# Patient Record
Sex: Male | Born: 1965 | Race: White | Hispanic: No | Marital: Married
Health system: Southern US, Community
[De-identification: ages and names within clinical notes are randomized; demographics above are authoritative.]

---

## 2015-04-22 ENCOUNTER — Other Ambulatory Visit (HOSPITAL_COMMUNITY): Payer: Self-pay | Admitting: Family Medicine

## 2015-04-22 DIAGNOSIS — N183 Hypertensive chronic kidney disease with stage 1 through stage 4 chronic kidney disease, or unspecified chronic kidney disease: Secondary | ICD-10-CM

## 2015-04-22 DIAGNOSIS — N19 Unspecified kidney failure: Secondary | ICD-10-CM

## 2015-04-22 DIAGNOSIS — I129 Hypertensive chronic kidney disease with stage 1 through stage 4 chronic kidney disease, or unspecified chronic kidney disease: Secondary | ICD-10-CM

## 2015-04-27 ENCOUNTER — Ambulatory Visit (HOSPITAL_COMMUNITY)
Admission: RE | Admit: 2015-04-27 | Discharge: 2015-04-27 | Disposition: A | Discharge status: Home / Self Care | Payer: Commercial Managed Care - PPO | Source: Ambulatory Visit | Attending: Family Medicine | Admitting: Family Medicine

## 2015-04-27 DIAGNOSIS — N183 Chronic kidney disease, stage 3 unspecified: Secondary | ICD-10-CM

## 2015-04-27 DIAGNOSIS — N19 Unspecified kidney failure: Secondary | ICD-10-CM

## 2015-04-27 DIAGNOSIS — I129 Hypertensive chronic kidney disease with stage 1 through stage 4 chronic kidney disease, or unspecified chronic kidney disease: Secondary | ICD-10-CM

## 2015-08-06 IMAGING — US US RENAL
1 series · 14 of 25 positions shown · non-contrast
Comparison: None.

CLINICAL DATA: Stage 3 chronic kidney disease

EXAM:
RENAL / URINARY TRACT ULTRASOUND COMPLETE

[Series 1: us renal · 0.25mm/px · 14 of 30 slices shown]
[im 1/30]
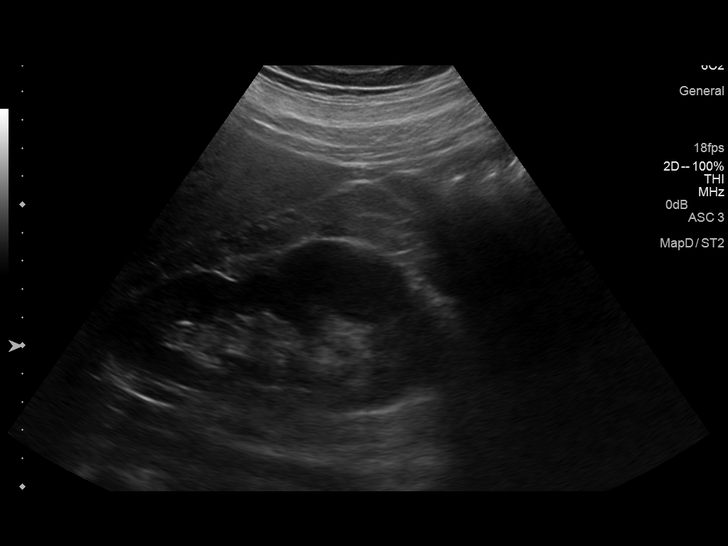
[im 3/30]
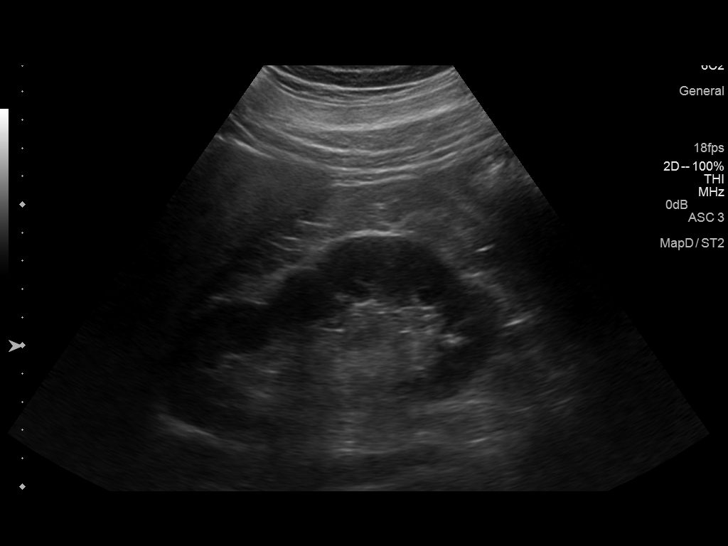
[im 5/30]
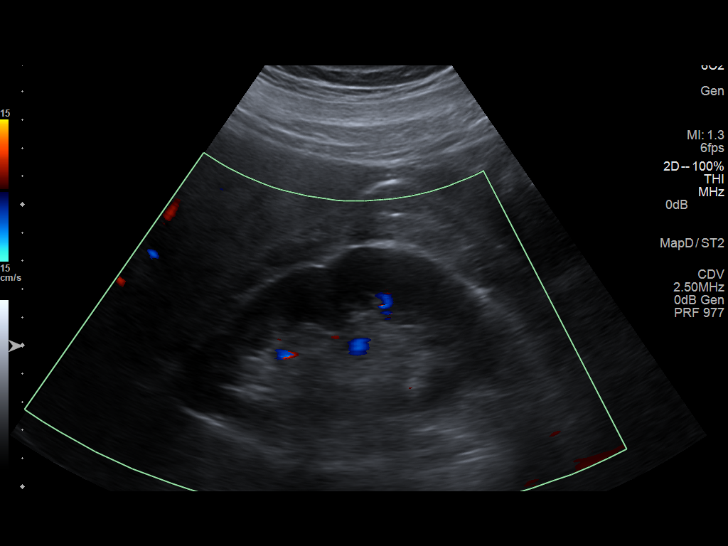
[im 8/30]
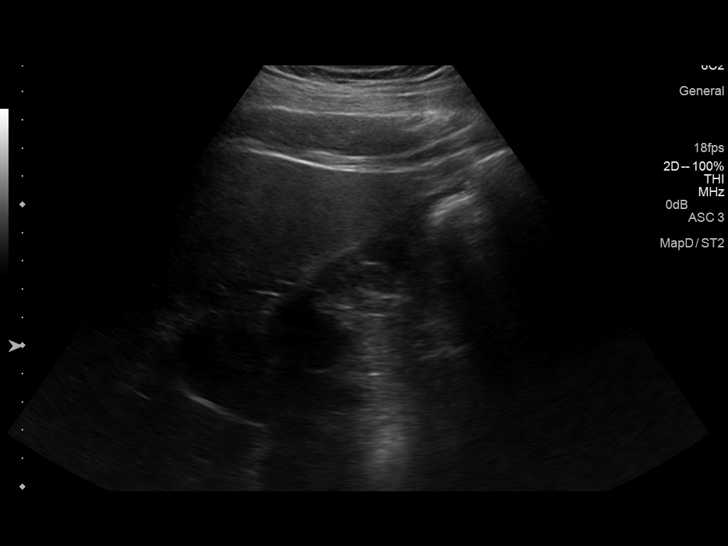
[im 10/30]
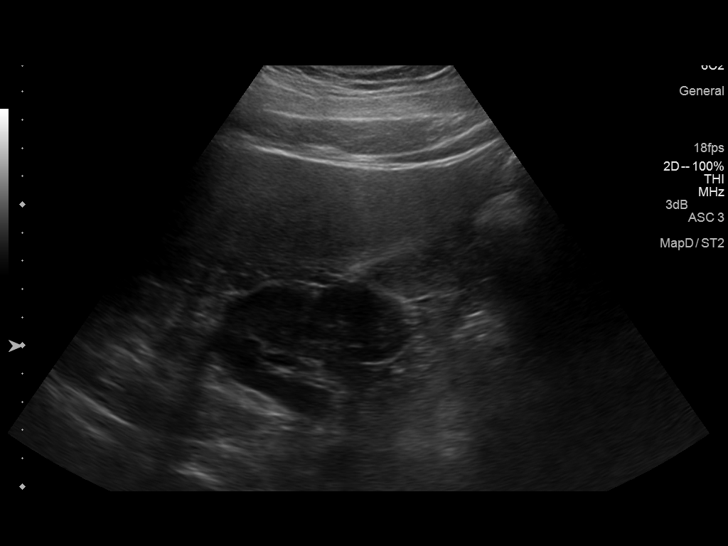
[im 11/30]
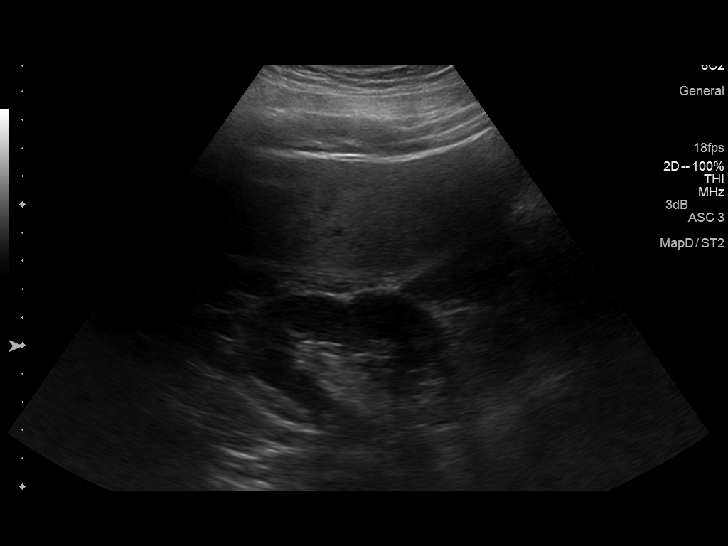
[im 14/30]
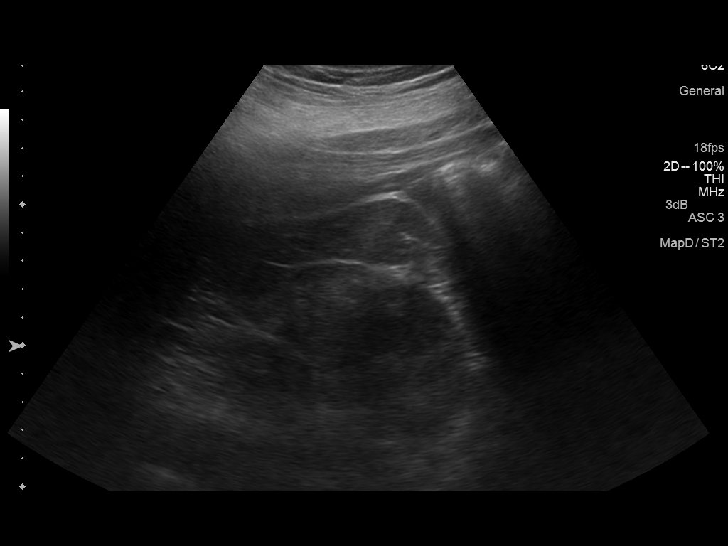
[im 16/30]
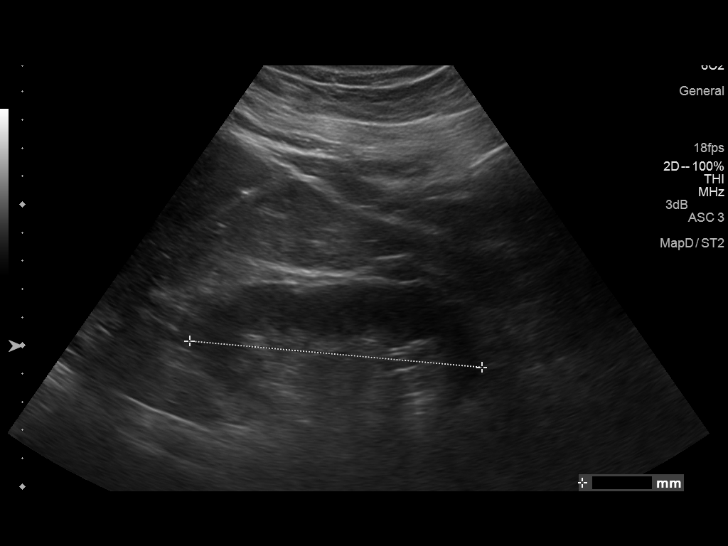
[im 19/30]
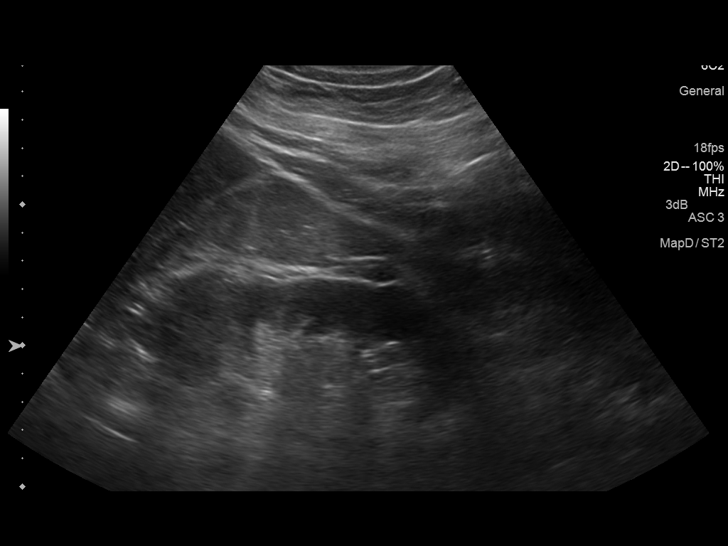
[im 20/30]
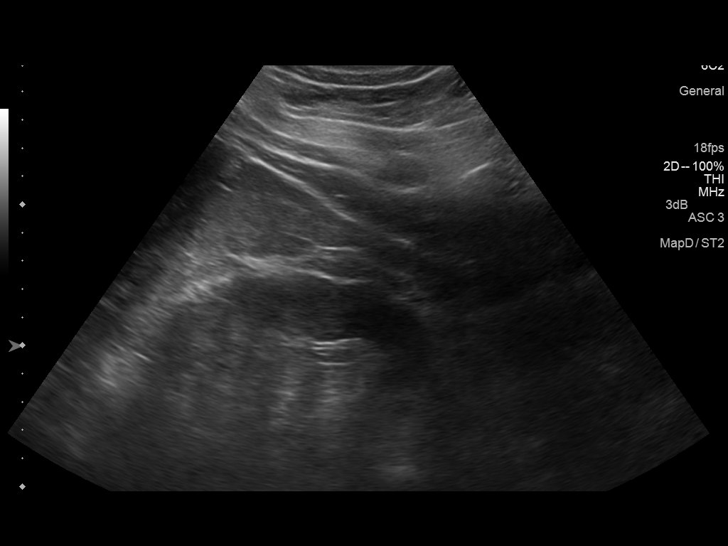
[im 22/30]
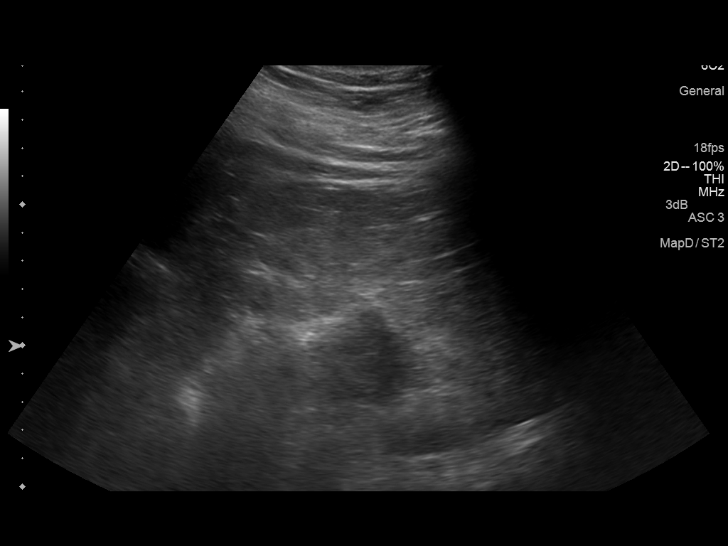
[im 25/30]
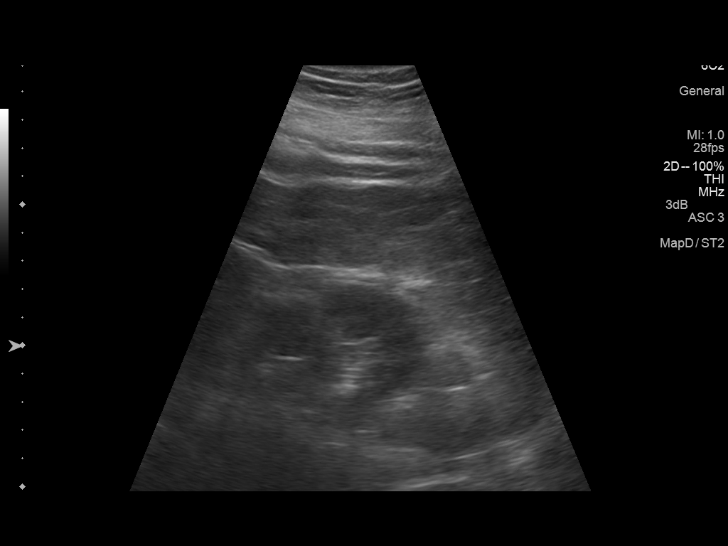
[im 27/30]
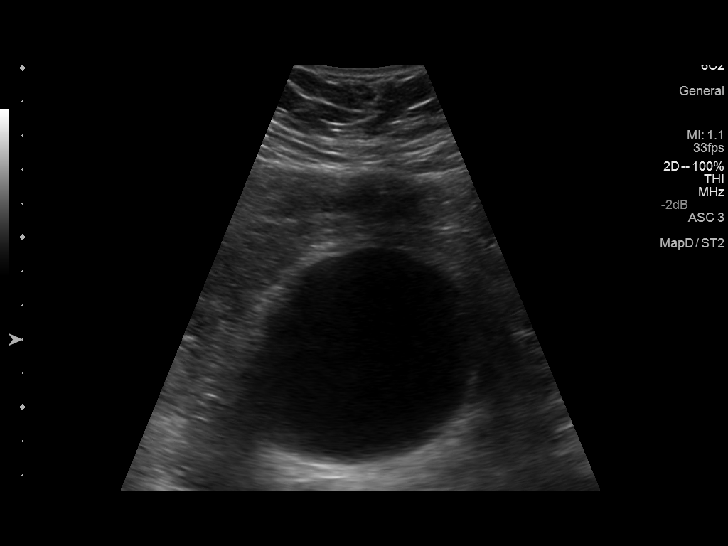
[im 30/30]
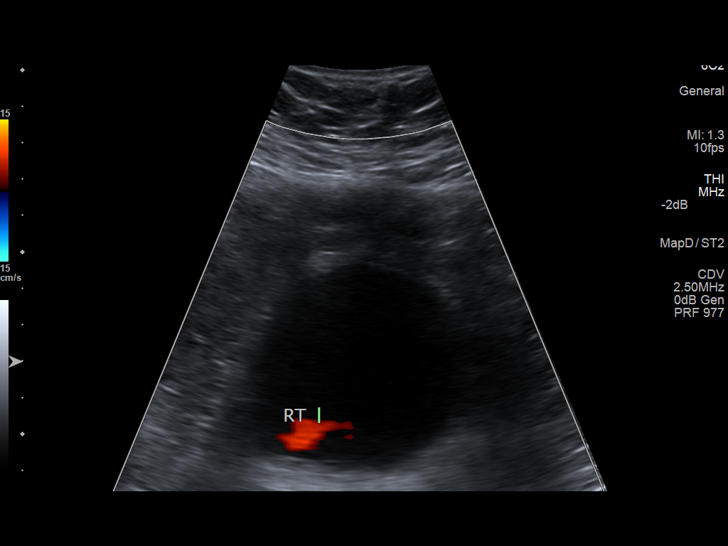

[14 of 25 positions shown; findings below may reference images not displayed]

FINDINGS: Right Kidney:

Length: 12.6 cm. Echogenicity within normal limits. No mass or
hydronephrosis visualized.

Left Kidney:

Length: 10.4 cm. Echogenicity within normal limits. No mass or
hydronephrosis visualized.

Bladder:

Appears normal for degree of bladder distention.
IMPRESSION: Normal renal ultrasound

## 2016-01-13 DIAGNOSIS — R7301 Impaired fasting glucose: Secondary | ICD-10-CM | POA: Diagnosis not present

## 2016-01-13 DIAGNOSIS — E782 Mixed hyperlipidemia: Secondary | ICD-10-CM | POA: Diagnosis not present

## 2016-01-13 DIAGNOSIS — N183 Chronic kidney disease, stage 3 (moderate): Secondary | ICD-10-CM | POA: Diagnosis not present

## 2016-01-13 DIAGNOSIS — I10 Essential (primary) hypertension: Secondary | ICD-10-CM | POA: Diagnosis not present

## 2016-01-20 DIAGNOSIS — G4733 Obstructive sleep apnea (adult) (pediatric): Secondary | ICD-10-CM | POA: Diagnosis not present

## 2016-01-20 DIAGNOSIS — F410 Panic disorder [episodic paroxysmal anxiety] without agoraphobia: Secondary | ICD-10-CM | POA: Diagnosis not present

## 2016-01-20 DIAGNOSIS — E8881 Metabolic syndrome: Secondary | ICD-10-CM | POA: Diagnosis not present

## 2016-01-20 DIAGNOSIS — E782 Mixed hyperlipidemia: Secondary | ICD-10-CM | POA: Diagnosis not present

## 2016-01-20 DIAGNOSIS — Z1389 Encounter for screening for other disorder: Secondary | ICD-10-CM | POA: Diagnosis not present

## 2016-01-24 DIAGNOSIS — G4733 Obstructive sleep apnea (adult) (pediatric): Secondary | ICD-10-CM | POA: Diagnosis not present

## 2016-03-06 DIAGNOSIS — S91312A Laceration without foreign body, left foot, initial encounter: Secondary | ICD-10-CM | POA: Diagnosis not present

## 2016-03-06 DIAGNOSIS — Z23 Encounter for immunization: Secondary | ICD-10-CM | POA: Diagnosis not present

## 2016-03-10 DIAGNOSIS — F410 Panic disorder [episodic paroxysmal anxiety] without agoraphobia: Secondary | ICD-10-CM | POA: Diagnosis not present

## 2016-03-10 DIAGNOSIS — E8881 Metabolic syndrome: Secondary | ICD-10-CM | POA: Diagnosis not present

## 2016-03-10 DIAGNOSIS — G4733 Obstructive sleep apnea (adult) (pediatric): Secondary | ICD-10-CM | POA: Diagnosis not present

## 2016-03-10 DIAGNOSIS — E782 Mixed hyperlipidemia: Secondary | ICD-10-CM | POA: Diagnosis not present

## 2016-03-10 DIAGNOSIS — I10 Essential (primary) hypertension: Secondary | ICD-10-CM | POA: Diagnosis not present

## 2016-03-16 DIAGNOSIS — E782 Mixed hyperlipidemia: Secondary | ICD-10-CM | POA: Diagnosis not present

## 2016-03-16 DIAGNOSIS — E8881 Metabolic syndrome: Secondary | ICD-10-CM | POA: Diagnosis not present

## 2016-03-16 DIAGNOSIS — F410 Panic disorder [episodic paroxysmal anxiety] without agoraphobia: Secondary | ICD-10-CM | POA: Diagnosis not present

## 2016-03-16 DIAGNOSIS — G4733 Obstructive sleep apnea (adult) (pediatric): Secondary | ICD-10-CM | POA: Diagnosis not present

## 2016-04-26 DIAGNOSIS — G4733 Obstructive sleep apnea (adult) (pediatric): Secondary | ICD-10-CM | POA: Diagnosis not present

## 2016-07-19 DIAGNOSIS — R7301 Impaired fasting glucose: Secondary | ICD-10-CM | POA: Diagnosis not present

## 2016-07-19 DIAGNOSIS — F410 Panic disorder [episodic paroxysmal anxiety] without agoraphobia: Secondary | ICD-10-CM | POA: Diagnosis not present

## 2016-07-19 DIAGNOSIS — I10 Essential (primary) hypertension: Secondary | ICD-10-CM | POA: Diagnosis not present

## 2016-07-19 DIAGNOSIS — E782 Mixed hyperlipidemia: Secondary | ICD-10-CM | POA: Diagnosis not present

## 2016-07-19 DIAGNOSIS — E8881 Metabolic syndrome: Secondary | ICD-10-CM | POA: Diagnosis not present

## 2016-07-21 DIAGNOSIS — F410 Panic disorder [episodic paroxysmal anxiety] without agoraphobia: Secondary | ICD-10-CM | POA: Diagnosis not present

## 2016-07-21 DIAGNOSIS — E782 Mixed hyperlipidemia: Secondary | ICD-10-CM | POA: Diagnosis not present

## 2016-07-21 DIAGNOSIS — E8881 Metabolic syndrome: Secondary | ICD-10-CM | POA: Diagnosis not present

## 2016-07-21 DIAGNOSIS — G4733 Obstructive sleep apnea (adult) (pediatric): Secondary | ICD-10-CM | POA: Diagnosis not present

## 2016-11-18 DIAGNOSIS — F410 Panic disorder [episodic paroxysmal anxiety] without agoraphobia: Secondary | ICD-10-CM | POA: Diagnosis not present

## 2016-11-18 DIAGNOSIS — I10 Essential (primary) hypertension: Secondary | ICD-10-CM | POA: Diagnosis not present

## 2016-11-18 DIAGNOSIS — E8881 Metabolic syndrome: Secondary | ICD-10-CM | POA: Diagnosis not present

## 2016-11-18 DIAGNOSIS — E782 Mixed hyperlipidemia: Secondary | ICD-10-CM | POA: Diagnosis not present

## 2016-11-18 DIAGNOSIS — R7301 Impaired fasting glucose: Secondary | ICD-10-CM | POA: Diagnosis not present

## 2016-11-22 DIAGNOSIS — G4733 Obstructive sleep apnea (adult) (pediatric): Secondary | ICD-10-CM | POA: Diagnosis not present

## 2016-11-22 DIAGNOSIS — F410 Panic disorder [episodic paroxysmal anxiety] without agoraphobia: Secondary | ICD-10-CM | POA: Diagnosis not present

## 2016-11-22 DIAGNOSIS — E782 Mixed hyperlipidemia: Secondary | ICD-10-CM | POA: Diagnosis not present

## 2016-11-22 DIAGNOSIS — E8881 Metabolic syndrome: Secondary | ICD-10-CM | POA: Diagnosis not present

## 2017-02-07 DIAGNOSIS — G4733 Obstructive sleep apnea (adult) (pediatric): Secondary | ICD-10-CM | POA: Diagnosis not present

## 2017-02-14 DIAGNOSIS — H8113 Benign paroxysmal vertigo, bilateral: Secondary | ICD-10-CM | POA: Diagnosis not present

## 2017-02-14 DIAGNOSIS — Z6838 Body mass index (BMI) 38.0-38.9, adult: Secondary | ICD-10-CM | POA: Diagnosis not present

## 2017-03-21 DIAGNOSIS — G4733 Obstructive sleep apnea (adult) (pediatric): Secondary | ICD-10-CM | POA: Diagnosis not present

## 2017-03-21 DIAGNOSIS — E782 Mixed hyperlipidemia: Secondary | ICD-10-CM | POA: Diagnosis not present

## 2017-03-21 DIAGNOSIS — E8881 Metabolic syndrome: Secondary | ICD-10-CM | POA: Diagnosis not present

## 2017-03-21 DIAGNOSIS — R7301 Impaired fasting glucose: Secondary | ICD-10-CM | POA: Diagnosis not present

## 2017-03-21 DIAGNOSIS — I10 Essential (primary) hypertension: Secondary | ICD-10-CM | POA: Diagnosis not present

## 2017-03-24 DIAGNOSIS — F410 Panic disorder [episodic paroxysmal anxiety] without agoraphobia: Secondary | ICD-10-CM | POA: Diagnosis not present

## 2017-03-24 DIAGNOSIS — E782 Mixed hyperlipidemia: Secondary | ICD-10-CM | POA: Diagnosis not present

## 2017-03-24 DIAGNOSIS — Z1389 Encounter for screening for other disorder: Secondary | ICD-10-CM | POA: Diagnosis not present

## 2017-03-24 DIAGNOSIS — E8881 Metabolic syndrome: Secondary | ICD-10-CM | POA: Diagnosis not present

## 2017-03-24 DIAGNOSIS — I10 Essential (primary) hypertension: Secondary | ICD-10-CM | POA: Diagnosis not present

## 2017-07-20 DIAGNOSIS — Z0001 Encounter for general adult medical examination with abnormal findings: Secondary | ICD-10-CM | POA: Diagnosis not present

## 2017-07-25 DIAGNOSIS — F410 Panic disorder [episodic paroxysmal anxiety] without agoraphobia: Secondary | ICD-10-CM | POA: Diagnosis not present

## 2017-07-25 DIAGNOSIS — I10 Essential (primary) hypertension: Secondary | ICD-10-CM | POA: Diagnosis not present

## 2017-07-25 DIAGNOSIS — E8881 Metabolic syndrome: Secondary | ICD-10-CM | POA: Diagnosis not present

## 2017-07-25 DIAGNOSIS — Z0001 Encounter for general adult medical examination with abnormal findings: Secondary | ICD-10-CM | POA: Diagnosis not present

## 2017-07-25 DIAGNOSIS — E782 Mixed hyperlipidemia: Secondary | ICD-10-CM | POA: Diagnosis not present

## 2017-09-30 DIAGNOSIS — Z6838 Body mass index (BMI) 38.0-38.9, adult: Secondary | ICD-10-CM | POA: Diagnosis not present

## 2017-09-30 DIAGNOSIS — J209 Acute bronchitis, unspecified: Secondary | ICD-10-CM | POA: Diagnosis not present

## 2017-09-30 DIAGNOSIS — R05 Cough: Secondary | ICD-10-CM | POA: Diagnosis not present

## 2017-09-30 DIAGNOSIS — R062 Wheezing: Secondary | ICD-10-CM | POA: Diagnosis not present

## 2017-11-17 DIAGNOSIS — N183 Chronic kidney disease, stage 3 (moderate): Secondary | ICD-10-CM | POA: Diagnosis not present

## 2017-11-17 DIAGNOSIS — I10 Essential (primary) hypertension: Secondary | ICD-10-CM | POA: Diagnosis not present

## 2017-11-17 DIAGNOSIS — E8881 Metabolic syndrome: Secondary | ICD-10-CM | POA: Diagnosis not present

## 2017-11-17 DIAGNOSIS — E782 Mixed hyperlipidemia: Secondary | ICD-10-CM | POA: Diagnosis not present

## 2017-11-22 DIAGNOSIS — E8881 Metabolic syndrome: Secondary | ICD-10-CM | POA: Diagnosis not present

## 2017-11-22 DIAGNOSIS — E782 Mixed hyperlipidemia: Secondary | ICD-10-CM | POA: Diagnosis not present

## 2017-11-22 DIAGNOSIS — I10 Essential (primary) hypertension: Secondary | ICD-10-CM | POA: Diagnosis not present

## 2017-11-22 DIAGNOSIS — F410 Panic disorder [episodic paroxysmal anxiety] without agoraphobia: Secondary | ICD-10-CM | POA: Diagnosis not present

## 2017-11-22 DIAGNOSIS — Z23 Encounter for immunization: Secondary | ICD-10-CM | POA: Diagnosis not present

## 2018-01-17 DIAGNOSIS — M722 Plantar fascial fibromatosis: Secondary | ICD-10-CM | POA: Diagnosis not present

## 2018-01-17 DIAGNOSIS — M79671 Pain in right foot: Secondary | ICD-10-CM | POA: Diagnosis not present

## 2018-02-14 DIAGNOSIS — M722 Plantar fascial fibromatosis: Secondary | ICD-10-CM | POA: Diagnosis not present

## 2018-02-14 DIAGNOSIS — M79671 Pain in right foot: Secondary | ICD-10-CM | POA: Diagnosis not present

## 2018-02-14 DIAGNOSIS — M79672 Pain in left foot: Secondary | ICD-10-CM | POA: Diagnosis not present

## 2018-03-15 DIAGNOSIS — Z9189 Other specified personal risk factors, not elsewhere classified: Secondary | ICD-10-CM | POA: Diagnosis not present

## 2018-03-15 DIAGNOSIS — E8881 Metabolic syndrome: Secondary | ICD-10-CM | POA: Diagnosis not present

## 2018-03-15 DIAGNOSIS — E782 Mixed hyperlipidemia: Secondary | ICD-10-CM | POA: Diagnosis not present

## 2018-03-15 DIAGNOSIS — G4733 Obstructive sleep apnea (adult) (pediatric): Secondary | ICD-10-CM | POA: Diagnosis not present

## 2018-03-15 DIAGNOSIS — I10 Essential (primary) hypertension: Secondary | ICD-10-CM | POA: Diagnosis not present

## 2018-03-20 DIAGNOSIS — E8881 Metabolic syndrome: Secondary | ICD-10-CM | POA: Diagnosis not present

## 2018-03-20 DIAGNOSIS — G4733 Obstructive sleep apnea (adult) (pediatric): Secondary | ICD-10-CM | POA: Diagnosis not present

## 2018-03-20 DIAGNOSIS — E782 Mixed hyperlipidemia: Secondary | ICD-10-CM | POA: Diagnosis not present

## 2018-03-20 DIAGNOSIS — F410 Panic disorder [episodic paroxysmal anxiety] without agoraphobia: Secondary | ICD-10-CM | POA: Diagnosis not present

## 2018-07-17 DIAGNOSIS — Z0001 Encounter for general adult medical examination with abnormal findings: Secondary | ICD-10-CM | POA: Diagnosis not present

## 2018-07-19 DIAGNOSIS — G4733 Obstructive sleep apnea (adult) (pediatric): Secondary | ICD-10-CM | POA: Diagnosis not present

## 2018-07-27 DIAGNOSIS — E8881 Metabolic syndrome: Secondary | ICD-10-CM | POA: Diagnosis not present

## 2018-07-27 DIAGNOSIS — I10 Essential (primary) hypertension: Secondary | ICD-10-CM | POA: Diagnosis not present

## 2018-07-27 DIAGNOSIS — Z0001 Encounter for general adult medical examination with abnormal findings: Secondary | ICD-10-CM | POA: Diagnosis not present

## 2018-07-27 DIAGNOSIS — Z23 Encounter for immunization: Secondary | ICD-10-CM | POA: Diagnosis not present

## 2018-07-27 DIAGNOSIS — E782 Mixed hyperlipidemia: Secondary | ICD-10-CM | POA: Diagnosis not present

## 2018-07-27 DIAGNOSIS — F410 Panic disorder [episodic paroxysmal anxiety] without agoraphobia: Secondary | ICD-10-CM | POA: Diagnosis not present

## 2018-10-16 DIAGNOSIS — J0101 Acute recurrent maxillary sinusitis: Secondary | ICD-10-CM | POA: Diagnosis not present

## 2018-10-16 DIAGNOSIS — Z6838 Body mass index (BMI) 38.0-38.9, adult: Secondary | ICD-10-CM | POA: Diagnosis not present

## 2018-10-16 DIAGNOSIS — J209 Acute bronchitis, unspecified: Secondary | ICD-10-CM | POA: Diagnosis not present

## 2018-11-14 DIAGNOSIS — J101 Influenza due to other identified influenza virus with other respiratory manifestations: Secondary | ICD-10-CM | POA: Diagnosis not present

## 2018-11-14 DIAGNOSIS — R509 Fever, unspecified: Secondary | ICD-10-CM | POA: Diagnosis not present

## 2018-11-14 DIAGNOSIS — Z6838 Body mass index (BMI) 38.0-38.9, adult: Secondary | ICD-10-CM | POA: Diagnosis not present

## 2018-12-03 DIAGNOSIS — I10 Essential (primary) hypertension: Secondary | ICD-10-CM | POA: Diagnosis not present

## 2018-12-03 DIAGNOSIS — E782 Mixed hyperlipidemia: Secondary | ICD-10-CM | POA: Diagnosis not present

## 2018-12-03 DIAGNOSIS — E8881 Metabolic syndrome: Secondary | ICD-10-CM | POA: Diagnosis not present

## 2018-12-03 DIAGNOSIS — N183 Chronic kidney disease, stage 3 (moderate): Secondary | ICD-10-CM | POA: Diagnosis not present

## 2018-12-03 DIAGNOSIS — R7301 Impaired fasting glucose: Secondary | ICD-10-CM | POA: Diagnosis not present

## 2018-12-25 DIAGNOSIS — E782 Mixed hyperlipidemia: Secondary | ICD-10-CM | POA: Diagnosis not present

## 2018-12-25 DIAGNOSIS — I10 Essential (primary) hypertension: Secondary | ICD-10-CM | POA: Diagnosis not present

## 2018-12-25 DIAGNOSIS — F410 Panic disorder [episodic paroxysmal anxiety] without agoraphobia: Secondary | ICD-10-CM | POA: Diagnosis not present

## 2018-12-25 DIAGNOSIS — E8881 Metabolic syndrome: Secondary | ICD-10-CM | POA: Diagnosis not present

## 2018-12-25 DIAGNOSIS — Z23 Encounter for immunization: Secondary | ICD-10-CM | POA: Diagnosis not present

## 2019-03-06 DIAGNOSIS — G4733 Obstructive sleep apnea (adult) (pediatric): Secondary | ICD-10-CM | POA: Diagnosis not present

## 2019-07-10 DIAGNOSIS — E782 Mixed hyperlipidemia: Secondary | ICD-10-CM | POA: Diagnosis not present

## 2019-07-10 DIAGNOSIS — R7301 Impaired fasting glucose: Secondary | ICD-10-CM | POA: Diagnosis not present

## 2019-07-10 DIAGNOSIS — E8881 Metabolic syndrome: Secondary | ICD-10-CM | POA: Diagnosis not present

## 2019-07-10 DIAGNOSIS — I10 Essential (primary) hypertension: Secondary | ICD-10-CM | POA: Diagnosis not present

## 2019-07-15 DIAGNOSIS — I10 Essential (primary) hypertension: Secondary | ICD-10-CM | POA: Diagnosis not present

## 2019-07-15 DIAGNOSIS — Z1389 Encounter for screening for other disorder: Secondary | ICD-10-CM | POA: Diagnosis not present

## 2019-07-15 DIAGNOSIS — Z1331 Encounter for screening for depression: Secondary | ICD-10-CM | POA: Diagnosis not present

## 2019-07-15 DIAGNOSIS — E782 Mixed hyperlipidemia: Secondary | ICD-10-CM | POA: Diagnosis not present

## 2019-07-15 DIAGNOSIS — F410 Panic disorder [episodic paroxysmal anxiety] without agoraphobia: Secondary | ICD-10-CM | POA: Diagnosis not present

## 2019-07-15 DIAGNOSIS — G4733 Obstructive sleep apnea (adult) (pediatric): Secondary | ICD-10-CM | POA: Diagnosis not present

## 2019-07-15 DIAGNOSIS — Z23 Encounter for immunization: Secondary | ICD-10-CM | POA: Diagnosis not present

## 2019-07-15 DIAGNOSIS — F331 Major depressive disorder, recurrent, moderate: Secondary | ICD-10-CM | POA: Diagnosis not present

## 2019-07-15 DIAGNOSIS — R7301 Impaired fasting glucose: Secondary | ICD-10-CM | POA: Diagnosis not present

## 2019-07-15 DIAGNOSIS — E8881 Metabolic syndrome: Secondary | ICD-10-CM | POA: Diagnosis not present

## 2019-08-06 DIAGNOSIS — U071 COVID-19: Secondary | ICD-10-CM | POA: Diagnosis not present

## 2019-12-07 DIAGNOSIS — J069 Acute upper respiratory infection, unspecified: Secondary | ICD-10-CM | POA: Diagnosis not present

## 2019-12-07 DIAGNOSIS — Z20828 Contact with and (suspected) exposure to other viral communicable diseases: Secondary | ICD-10-CM | POA: Diagnosis not present

## 2020-05-14 DIAGNOSIS — Z23 Encounter for immunization: Secondary | ICD-10-CM | POA: Diagnosis not present

## 2020-06-18 DIAGNOSIS — Z23 Encounter for immunization: Secondary | ICD-10-CM | POA: Diagnosis not present

## 2020-07-03 DIAGNOSIS — E8881 Metabolic syndrome: Secondary | ICD-10-CM | POA: Diagnosis not present

## 2020-07-03 DIAGNOSIS — E782 Mixed hyperlipidemia: Secondary | ICD-10-CM | POA: Diagnosis not present

## 2020-07-03 DIAGNOSIS — N183 Chronic kidney disease, stage 3 unspecified: Secondary | ICD-10-CM | POA: Diagnosis not present

## 2020-07-03 DIAGNOSIS — R7301 Impaired fasting glucose: Secondary | ICD-10-CM | POA: Diagnosis not present

## 2020-07-03 DIAGNOSIS — I10 Essential (primary) hypertension: Secondary | ICD-10-CM | POA: Diagnosis not present

## 2020-07-09 DIAGNOSIS — E8881 Metabolic syndrome: Secondary | ICD-10-CM | POA: Diagnosis not present

## 2020-07-09 DIAGNOSIS — F410 Panic disorder [episodic paroxysmal anxiety] without agoraphobia: Secondary | ICD-10-CM | POA: Diagnosis not present

## 2020-07-09 DIAGNOSIS — Z0001 Encounter for general adult medical examination with abnormal findings: Secondary | ICD-10-CM | POA: Diagnosis not present

## 2020-07-09 DIAGNOSIS — G4733 Obstructive sleep apnea (adult) (pediatric): Secondary | ICD-10-CM | POA: Diagnosis not present

## 2020-07-09 DIAGNOSIS — R4582 Worries: Secondary | ICD-10-CM | POA: Diagnosis not present

## 2020-07-09 DIAGNOSIS — E782 Mixed hyperlipidemia: Secondary | ICD-10-CM | POA: Diagnosis not present

## 2020-09-17 DIAGNOSIS — E8881 Metabolic syndrome: Secondary | ICD-10-CM | POA: Diagnosis not present

## 2020-09-17 DIAGNOSIS — I10 Essential (primary) hypertension: Secondary | ICD-10-CM | POA: Diagnosis not present

## 2020-09-17 DIAGNOSIS — R4582 Worries: Secondary | ICD-10-CM | POA: Diagnosis not present

## 2020-09-17 DIAGNOSIS — G4733 Obstructive sleep apnea (adult) (pediatric): Secondary | ICD-10-CM | POA: Diagnosis not present

## 2020-09-17 DIAGNOSIS — E782 Mixed hyperlipidemia: Secondary | ICD-10-CM | POA: Diagnosis not present

## 2023-05-01 ENCOUNTER — Telehealth (HOSPITAL_COMMUNITY): Payer: Self-pay

## 2023-05-01 NOTE — Telephone Encounter (Signed)
TMS C: Placed outgoing call to pt's spouse (verbalized as preferred form of contact as spouse answers phone more frequently).   Left vm requesting for pt to call Coordinator back to complete phone screen and potentially schedule TMS consultation; alerted pt that faxed med records were received but still working to get transferred into electronic system.

## 2023-05-03 ENCOUNTER — Telehealth (HOSPITAL_COMMUNITY): Payer: Self-pay

## 2023-05-03 NOTE — Telephone Encounter (Signed)
TMS C: Received completed verification of benefits with assistance of [employer]. Pt originally stated they are in-network with [facility]; Adam [place]ix unable to determine due to [employer] [place]. Patient will have a co-pay of $20.00, but will be 100% covered after deducible is met. If out-of-network, only 70% will be covered. Coordinator continues to await returned call from pt to conduct phone screen & schedule consult.

## 2023-05-18 ENCOUNTER — Telehealth (HOSPITAL_COMMUNITY): Payer: Self-pay

## 2023-05-18 NOTE — Telephone Encounter (Signed)
TMS C: Placed outgoing phone call to patient and to patient's wife's cell phones. Left voicemail on pt's wife's phone informing that med records from PCP and psychiatrist have been received (tech issues in scanning into system, paper copies) and reviewed. Requested pt or pt's wife give a call back to conduct phone screening and set up TMS consult. Will await pt's returned call.

## 2023-05-18 NOTE — Telephone Encounter (Signed)
TMS C: Received incoming fax from patient's PCP that contained external med records. Coordinator will review records, scan into system, then contact pt for TMS phone screening.

## 2023-05-19 ENCOUNTER — Telehealth (HOSPITAL_COMMUNITY): Payer: Self-pay

## 2023-05-19 NOTE — Telephone Encounter (Signed)
TMS C: Placed outgoing phone call to patient and conducted phone screening for TMS treatment. Patient is eligible for treatment and is hoping that treatment will assist with anxiety/depression, along with ruminating compulsive thoughts. Patient scored a 16 on PHQ-9; patient consented to Coordinator calling back later on in the day to conduct a Beck's assessment to get a full range of scoring severity.

## 2023-05-19 NOTE — Telephone Encounter (Signed)
TMS C:Placed outgoing call to patient to conduct Beck's Depression Scale. Patient scored a 47/63 and depression severity is evaluated as "severe-to-extreme." Coordinator informed patient they still qualify for tx and on Monday (05/22/23) would check provider's availability for a consultation appt within the next week. Coordinator requested pt call their insurance to re-verify benefits. Patient agreed and will look forward to Coordinator's call on Monday by EOD.

## 2023-05-23 ENCOUNTER — Telehealth (HOSPITAL_COMMUNITY): Payer: Self-pay

## 2023-05-23 NOTE — Telephone Encounter (Signed)
TMS C: Placed outgoing call to patient to discuss consultation appt. No answer, left vm requesting call back to confirm TMS consult and prepare for ppw.

## 2023-05-23 NOTE — Telephone Encounter (Signed)
TMS C: Received returned call from patient to discuss TMS consult. Patient confirmed attendance for appt on [facility]. Pt was provided address and instructions for arrival.   Coordinator requested pt to email copies of insurance card & driver's license (front and back) to place into system. Pt agreed and stated a family member could assist later on in the evening. Coordinator provided email address and instructions for how to send. Pt informed that new patient paperwork would be completed before initial mapping appt once approved via insurance. Pt agreed.

## 2023-05-25 ENCOUNTER — Other Ambulatory Visit (HOSPITAL_BASED_OUTPATIENT_CLINIC_OR_DEPARTMENT_OTHER): Payer: BC Managed Care – PPO | Admitting: Student in an Organized Health Care Education/Training Program

## 2023-05-25 ENCOUNTER — Encounter (HOSPITAL_COMMUNITY): Payer: Self-pay

## 2023-05-25 ENCOUNTER — Encounter (HOSPITAL_COMMUNITY): Payer: Self-pay | Admitting: Student in an Organized Health Care Education/Training Program

## 2023-05-25 DIAGNOSIS — F329 Major depressive disorder, single episode, unspecified: Secondary | ICD-10-CM | POA: Diagnosis not present

## 2023-05-25 DIAGNOSIS — F411 Generalized anxiety disorder: Secondary | ICD-10-CM

## 2023-05-25 DIAGNOSIS — F422 Mixed obsessional thoughts and acts: Secondary | ICD-10-CM

## 2023-05-25 NOTE — Progress Notes (Signed)
[employer] Consult Note  05/25/2023 2:31 PM LAWREN FLORER  MRN:  [id number]  Chief Complaint:  Chief Complaint  Patient presents with   TMS Consult   HPI:  Gary Howard is a 57 yr old male who presents for a TMS Consult.  PPHx is significant for Depression, Anxiety, and OCD, and no history of Suicide Attempts, Self Injurious Behavior, or Psychiatric Hospitalizations.  He reports that he has been dealing with depression, anxiety, and intrusive thoughts for most of his life.  He reports there was a period between age 28 and 89 where the intrusive thoughts did improve but then they returned.  He reports that he has been trialed on multiple medications and that he is either had to stop them due to side effects or they would work for a bit but then his symptoms would worsen again.  He reports that over a year ago he started going to his current outpatient psychiatrist and that while he initially had response to his medications his symptoms have returned and worsened again.  He reports the biggest thing is that he hates going to bed then knowing he is going to wake up continuing to have symptoms.  He reports past trials of Risperdal, Effexor, Cymbalta, Prozac, Seroquel, Wellbutrin, and BuSpar.  He is currently on Abilify, Lamictal, gabapentin, clomipramine, and Xanax.  He reports past psychiatric history statement for depression, anxiety, and OCD.  He reports no history of suicide attempts.  He reports no history of self-injurious behavior.  He reports no history of psychiatric hospitalizations.  He reports a past medical history significant for hypertension.  He reports no significant past surgical history.  He reports no history of head trauma.  He reports no history of seizures.  He reports NKDA.  He reports he currently lives in a house with his wife, daughter, and grandson.  He reports he graduated high school.  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  He reports he  has been working at [employer] but has been out of work since May due to his symptoms.  He reports no alcohol use for over 35 years.  He reports occasional snuff use.  He reports no illicit substance use.  He reports no current legal issues.  He reports he does have firearms but they are kept locked up in a safe with the ammo stored separately.  He reports no SI, HI, or AVH.  Discussed TMS with him.  Discussed TMS process and study results on treatment resistant depression.  Discussed the first appointment and what is involved with finding the MT.  Discussed with him that afterwards treatment sessions would last approximately 20 minutes.  Discussed that treatments are Monday through Friday for 6 weeks and then there is a taper period.  Discussed that there is no definitive timeline for how long results would last but that typically we expect at least 1 year of improvement to consider additional treatments in the future.  Discussed that he should continue taking their medications as prescribed by their outpatient provider discussed that the tech would run the treatments afterwards with their parameters saved into the system, however, if any questions or issues arose I would be immediately available by phone or could be on site.  Discussed potential risks and side effects including but not limited to: Discomfort at site of magnet placement, headache, or seizure.  Confirmed that there was no implanted/retained metal in the head (aneurysm clips, plates, screws).  Confirmed that there  is no significant dental/bridge work.  Confirmed that there is no implanted pacemaker/defibrillator.  Discussed that jewelry should not be worn or that it should be removed from the ear for treatment.  Discussed that treatments are done at [facility].  Discussed that treatment room can have lights dimmed, music played, or have the TV on during treatment sessions for comfort.  All questions were invited and answered. He were agreeable to proceed  with MT appointment.     Visit Diagnosis:    ICD-10-CM   1. Treatment-resistant depression  F32.9     2. Generalized anxiety disorder  F41.1     3. Mixed obsessional thoughts and acts  F42.2       Past Psychiatric History: Depression, Anxiety, and OCD, and no history of Suicide Attempts, Self Injurious Behavior, or Psychiatric Hospitalizations.  Past Medical History: No past medical history on file.   Family Psychiatric History:  Mother- Depression, Anxiety Sister- Depression Fraternal Twin Brother- Schizophrenia, EtOH Abuse, Suicide Attempt  Family History: No family history on file.  Social History:  Social History   Socioeconomic History   Marital status: Married    Spouse name: Not on file   Number of children: Not on file   Years of education: Not on file   Highest education level: Not on file  Occupational History   Not on file  Tobacco Use   Smoking status: Not on file   Smokeless tobacco: Not on file  Substance and Sexual Activity   Alcohol use: Not on file   Drug use: Not on file   Sexual activity: Not on file  Other Topics Concern   Not on file  Social History Narrative   Not on file   Social Determinants of Health   Financial Resource Strain: Not on file  Food Insecurity: Not on file  Transportation Needs: Not on file  Physical Activity: Not on file  Stress: Not on file  Social Connections: Unknown (02/05/2022)   [facility]   Social Network    Social Network: Not on file    Allergies: Not on File  Metabolic Disorder Labs: No results found for: "HGBA1C", "MPG" No results found for: "PROLACTIN" No results found for: "CHOL", "TRIG", "HDL", "CHOLHDL", "VLDL", "LDLCALC" No results found for: "TSH"  Therapeutic Level Labs: No results found for: "LITHIUM" No results found for: "VALPROATE" No results found for: "CBMZ"  Current Medications: No current outpatient medications on file.   No current facility-administered  medications for this visit.     Musculoskeletal: Strength & Muscle Tone: within normal limits Gait & Station: normal Patient leans: N/A  Psychiatric Specialty Exam: Review of Systems  Respiratory:  Negative for shortness of breath.   Cardiovascular:  Negative for chest pain.  Gastrointestinal:  Negative for abdominal pain, constipation, diarrhea, nausea and vomiting.  Neurological:  Negative for dizziness, weakness and headaches.  Psychiatric/Behavioral:  Positive for dysphoric mood. Negative for hallucinations and suicidal ideas. The patient is nervous/anxious.     There were no vitals taken for this visit.There is no height or weight on file to calculate BMI.  General Appearance: Casual and Fairly Groomed  Eye Contact:  Good  Speech:  Clear and Coherent and Normal Rate  Volume:  Normal  Mood:  Anxious and Dysphoric  Affect:  Congruent  Thought Process:  Coherent and Goal Directed  Orientation:  Full (Time, Place, and Person)  Thought Content: WDL and Logical   Suicidal Thoughts:  No  Homicidal Thoughts:  No  Memory:  Immediate;   Good Recent;   Good  Judgement:  Good  Insight:  Good  Psychomotor Activity:  Normal  Concentration:  Concentration: Good and Attention Span: Good  Recall:  Good  Fund of Knowledge: Good  Language: Good  Akathisia:  Negative  Handed:  Right  AIMS (if indicated): not done  Assets:  Communication Skills Desire for Improvement Financial Resources/Insurance Housing Resilience Social Support  ADL's:  Intact  Cognition: WNL  Sleep:  Fair   Screenings:   Assessment and Plan:  Gary Howard is a 57 yr old male who presents for a TMS Consult.  PPHx is significant for Depression, Anxiety, and OCD, and no history of Suicide Attempts, Self Injurious Behavior, or Psychiatric Hospitalizations.  Given his current medication regiment of Abilify, Lamictal, Gabapentin, Clomipramine, and Xanax and still scoring a 47 on the Becks as well as the number  of failed trials in the past he would be a good candidate for TMS.  He is interested in moving forward so we will schedule him for the MT appointment.     Collaboration of Care: Collaboration of Care:   Patient/Guardian was advised Release of Information must be obtained prior to any record release in order to collaborate their care with an outside provider. Patient/Guardian was advised if they have not already done so to contact the registration department to sign all necessary forms in order for [place] to release information regarding their care.   Consent: Patient/Guardian gives verbal consent for treatment and assignment of benefits for services provided during this visit. Patient/Guardian expressed understanding and agreed to proceed.    Lauro Franklin, MD 05/25/2023, 2:31 PM

## 2023-06-07 ENCOUNTER — Telehealth (HOSPITAL_COMMUNITY): Payer: Self-pay

## 2023-06-07 NOTE — Telephone Encounter (Signed)
TMS C: Placed outgoing call to patient's insurance [address] [place], [phone]). Representative Carey Bullocks E.) hung up the phone while Coordinator was providing credientials. Coordinator called back and spoke with representative Norie E.   Representative provided ref [phone]) for call and prior auth number. Forms to be faxed to TMS room.

## 2023-06-07 NOTE — Telephone Encounter (Signed)
TMS C: Placed outgoing call to patient. No response, left voicemail thanking patient for their patience and informing them of prior auth update. Coordinator stated that after going back and forth with [employer], they are requiring a signature from the attending provider before form can be submitted, which should be done by tomorrow (06/08/23). Coordinator concluded voicemail by letting patient know to reach out for any/all questions.

## 2023-06-08 ENCOUNTER — Telehealth (HOSPITAL_COMMUNITY): Payer: Self-pay

## 2023-06-08 NOTE — Telephone Encounter (Signed)
TMS C: Placed outgoing call to patient to confirm current authorization update and answer additional questions. Coordinator alerted patient that prior auth form is quite short, but if payor attempts to deny coverage then patient can come in and sign ROI form. Once signed, Coordinator can send external recs from PCP and Psychiatrist as additional clinicals. Patient remained agreeable and is looking forward to next update.

## 2023-06-12 ENCOUNTER — Telehealth (HOSPITAL_COMMUNITY): Payer: Self-pay

## 2023-06-12 NOTE — Telephone Encounter (Signed)
TMS C: Faxed prior authorization form (#[id number]) to patient's insurance - [employer] - on 06/12/23 after receiving final signature from attending provider. Will await pending decision before contacting patient.

## 2023-06-14 ENCOUNTER — Telehealth (HOSPITAL_COMMUNITY): Payer: Self-pay

## 2023-06-14 NOTE — Telephone Encounter (Signed)
TMS C: Placed outgoing call to [employer]. Unable to get through, pt ID not recognized via automated phone. Placed call to [facility], spoke with rep Randa Evens and requested to be transferred to [facility] to check on prior auth decision. Immediate transfer was given.  Coordinator was transferred to automated line & got through. Spoke with rep: Earlie Server. Prior authorization is still pending, but units for CPT [phone] are incorrect. Rep transferred Coordinator to case managers direct line [phone]). Left vm requesting units be change to reflect correct units as indicated on PA form.

## 2023-06-20 ENCOUNTER — Telehealth (HOSPITAL_COMMUNITY): Payer: Self-pay

## 2023-06-20 NOTE — Telephone Encounter (Signed)
TMS C: Received incoming call during Coordinator's documentation from pt's insurance, [employer]. Coordinator spoke with Leotis Shames [phone]) who oversees peer review calls/scheduling. Coordinator confirmed with provider before allowing rep to schedule the peer review call for Thursday (06/22/23) at 2:30pm with Dr. Merrily Brittle - direct line [phone]. Will await peer determination before updating pt.

## 2023-06-20 NOTE — Telephone Encounter (Signed)
TMS C: Received incoming call/vm from pt's payor - [employer]. Case Manager left name Sue Lush) and callback number [phone]). Case Manager stated that prior authorization is denied d/t changes with CPT code 09811; stated code is considered "investigative." Case manager stated two options of peer review or resubmitting request for [id number] - then she could approve CPT codes of [phone] & [phone]. Coordinator will discuss options with provider before calling back before deadline of 06/21/23 by EOD.

## 2023-06-20 NOTE — Telephone Encounter (Signed)
TMS C: Placed outgoing call to pt's payor, [employer]. Coordinator specifically called case manager Sue Lush [phone]) and was sent to vm. Coordinator left vm requesting call back to discuss authorization requirements and clarification on denied CPT code 36644. Will await returned call before updating pt.

## 2023-06-26 ENCOUNTER — Telehealth (HOSPITAL_COMMUNITY): Payer: Self-pay

## 2023-06-26 NOTE — Telephone Encounter (Signed)
TMS C: Placed outgoing call to patient to provide update on approved prior authorization. Coordinator apologized for length of time the process has taken, but thanked pt for patience. Coordinator informed pt that while approved, initial mapping appointment may take a minute to be schedule d/t provider availability. Pt provided their current availability (open schedule until 07/10/23, then will be out of town but returning on 07/20/23) and Coordinator discussed different scheduling options (Attending may be ready to schedule by Thursday, 10/3; then attending is unavailable till after 07/13/23). Pt remained agreeable throughout call and is looking forward to next update.

## 2023-07-17 ENCOUNTER — Telehealth (HOSPITAL_COMMUNITY): Payer: Self-pay

## 2023-07-17 NOTE — Telephone Encounter (Signed)
TMS C: Placed outgoing call to patient to apologize for delay in scheduling, but confirmed that provider's have 3 available times to perform the TMS initial mapping. Coordinator provided times to patient, patient elected to come in on Tuesday (07/25/23) at 2:00 pm. Patient stated concerns about going back to work in [place], and work schedule changes every week (MTW, off Th & F, etc). Patient asked if after initial mapping, they could have time to figure out work schedule before starting daily tx's. Coordinator agreed to pausing treatments after initial mapping until work schedule is figured out. Coordinator informed patient of decreased treatment effectiveness if not consistent, but confirmed that at least some effect will be had. Coordinator will relay updates to providers.

## 2023-07-17 NOTE — Telephone Encounter (Signed)
TMS C: Patient called and left vm requesting an appointment time change. While documenting vm, patient's wife called Coordinator to request a time change. Coordinator informed wife that second available appt had been taken, but 11/5 after 1 pm was still available. Patient's wife said to keep appt time the same but to provide patient with all information during reminder call on 10/28. Coordinator agreed to do so.

## 2023-07-18 ENCOUNTER — Telehealth (HOSPITAL_COMMUNITY): Payer: Self-pay

## 2023-07-18 NOTE — Telephone Encounter (Signed)
Tms C: Placed outgoing call to patient's insurance - [employer], to request an adjustment to patient's authorization period. Patient was approved from 06/26/23 - 09/23/23. Due to scheduling conflicts, patient's first appt is 07/25/23.   Coordinator spoke with Representative (name not provided) who "could not verify patient information or authorization number." Coordinator stated auth number provided, Representative said that was the call reference number. Coordinator explained that was the only number provided and was listed on initial request form. Representative attempted to direct Coordinator to calling separate line. Coordinator requested a Merchandiser, retail. Representative denied supervisor but transferred to case manager's direct office line. Coordinator left vm with request and provided callback number. Will await call back from case manager Sue Lush.

## 2023-07-24 ENCOUNTER — Telehealth (HOSPITAL_COMMUNITY): Payer: Self-pay

## 2023-07-24 NOTE — Telephone Encounter (Signed)
TMS C: Placed outgoing call to patient to remind them of initial mapping appointment for tomorrow (07/25/23) at 2pm. No answer, left vm. Coordinator provided address for treatment location and office callback number to inform patient of what to expect for Day 1.

## 2023-07-25 ENCOUNTER — Other Ambulatory Visit (HOSPITAL_COMMUNITY): Payer: BC Managed Care – PPO | Attending: Psychiatry

## 2023-07-25 DIAGNOSIS — F329 Major depressive disorder, single episode, unspecified: Secondary | ICD-10-CM | POA: Diagnosis present

## 2023-07-25 NOTE — Progress Notes (Signed)
Pt and their spouse reported to [facility] for cortical mapping and motor threshold determination for Repetitive Transcranial Magnetic Stimulation treatment for Major Depressive Disorder. Pt completed new patient paperwork and a PHQ-9; scored 18. Prior to procedure, pt also signed an informed consent agreement for TMS treatment. Pt's treatment area was found by applying single pulses to their left motor cortex, hunting along the anterior/posterior plane and along the superior oblique angle until the best motor response was elicited from the pt's right thumb.?The best response was observed at 14.25 cm? A/P, 28 degrees SEC for SOA, and a coil angle of -10?degrees. Pt's motor threshold was calculated using the [employer]'s proprietary MT Assist algorithm, which produced a calculated motor threshold of 1.55?SMT. Per these findings, pt's treatment parameters are as follows: A/P -- 14.25?cm, SOA -- 28 degrees SEC, Coil Angle -- ?-10?degrees, Motor Threshold -- 1.55?SMT.?With these parameters, the pt will receive 36 sessions of TMS according to the following protocol: 3000 pulses per session, with stimulation in bursts of pulses lasting 4 seconds at a frequency of 10 Hz, separated by 17 seconds of rest. After determining pt's tx parameters, coil was moved to the treatment location, and the first burst of pulses was applied at a reduced power of 50% MT. Pt reported no complaints. Upon completion of mapping, pt completed a few treatment intervals for observation of side effects. Pt tolerated tx fine, but increased anxiety showed additional tremors within patient's right hand. Attending and Resident provider agreed to observe patient for the next treatment session after patient takes their standard anti-anxiety medication (Xanax, 5mg ). Pt departed from clinic without issue or complaints.   ?

## 2023-07-26 ENCOUNTER — Other Ambulatory Visit (HOSPITAL_COMMUNITY): Payer: BC Managed Care – PPO | Attending: Psychiatry

## 2023-07-26 ENCOUNTER — Encounter (HOSPITAL_COMMUNITY): Payer: Self-pay | Admitting: Student in an Organized Health Care Education/Training Program

## 2023-07-26 DIAGNOSIS — F339 Major depressive disorder, recurrent, unspecified: Secondary | ICD-10-CM | POA: Insufficient documentation

## 2023-07-26 DIAGNOSIS — F329 Major depressive disorder, single episode, unspecified: Secondary | ICD-10-CM

## 2023-07-26 NOTE — Progress Notes (Signed)
Patient and their spouse reported to [facility] for session #2 of Repetitive Transcranial Magnetic Stimulation treatment for Major Depressive Disorder. Patient reported to TMS Tech and resident provider that no discomfort, headache, or abnormal symptoms had been observed following his previous session. Confirmed that anti-anxiety medication (Xanax) had been taken prior to session. Patient's spouse requested a note from resident provider to give to their employer confirming that they were in attendance for patient's treatment. Spouse plans to attend the first two weeks of sessions to reduce patient's anxiety. Resident provider agreed and work note was provided. Patient confirmed that they will be using a rubber mouthguard during treatments to reduce potential jaw pain/intensity of jaw movement.  Patient's treatment parameters are as follows: A/P -- 14.25?cm, SOA -- 28 degrees SEC, Coil Angle -- ?-10?degrees, Motor Threshold -- 1.55?SMT.?With these parameters, the patient will receive 36 sessions of TMS according to the following protocol: 3000 pulses per session, with stimulation in bursts of pulses lasting 4 seconds at a frequency of 10 Hz, separated by 17 seconds of rest. After determining patient's tx parameters, coil was moved to the treatment location, and the first burst of pulses was applied at a reduced power of 50% MT. Patient reported no complaints. TMS Tech slowly titrated treatment power to 65% and patient exhibited increased toleration of pulse trains. Patient confirmed no discomfort, lightheadedness, or dizziness were felt concluding the session. Patient and [employer] Tech discussed schedule for the remaining business week before patient departed from clinic.

## 2023-07-27 ENCOUNTER — Other Ambulatory Visit (HOSPITAL_COMMUNITY): Payer: BC Managed Care – PPO | Attending: Psychiatry

## 2023-07-27 DIAGNOSIS — F339 Major depressive disorder, recurrent, unspecified: Secondary | ICD-10-CM | POA: Diagnosis present

## 2023-07-27 DIAGNOSIS — F329 Major depressive disorder, single episode, unspecified: Secondary | ICD-10-CM

## 2023-07-27 NOTE — Progress Notes (Signed)
Patient and their spouse reported to [facility] for session #3 of Repetitive Transcranial Magnetic Stimulation treatment for Major Depressive Disorder. Patient reported to TMS Tech that a dull headache had occurred the previous evening, but it did not increase in severity or excessive duration. [employer] Tech assured patient that was normal and OTC medications could be taken to relieve discomfort. [employer] Tech confirmed with patient that all normal/daily medications, along with anti-anxiety med (Xanax), had been taken prior to session. Patient's spouse discussed next week's schedule and confirmed preferred treatment times. Spouse plans to attend the first two weeks of sessions to reduce patient's apprehension. Patient confirmed that they will be using a rubber mouthguard during treatments to reduce potential jaw pain/intensity of jaw movement.   Patient's treatment parameters are as follows: A/P -- 14.25?cm, SOA -- 28 degrees SEC, Coil Angle -- ?-10?degrees, Motor Threshold -- 1.55?SMT.?With these parameters, the patient will receive 36 sessions of TMS according to the following protocol: 3000 pulses per session, with stimulation in bursts of pulses lasting 4 seconds at a frequency of 10 Hz, separated by 17 seconds of rest. After determining patient's tx parameters, coil was moved to the treatment location, and the first burst of pulses was applied at a reduced power of 60% MT. Patient reported no complaints. TMS Tech slowly titrated treatment power up 5 % for every 5 minute interval. Patient exhibited increased toleration of pulse trains, concluded the session at 75%. Patient confirmed no discomfort, lightheadedness, or dizziness were felt when departing the session.

## 2023-07-28 ENCOUNTER — Other Ambulatory Visit (HOSPITAL_COMMUNITY): Payer: BC Managed Care – PPO

## 2023-07-28 ENCOUNTER — Other Ambulatory Visit (HOSPITAL_COMMUNITY): Payer: BC Managed Care – PPO | Attending: Psychiatry

## 2023-07-28 DIAGNOSIS — F408 Other phobic anxiety disorders: Secondary | ICD-10-CM | POA: Insufficient documentation

## 2023-07-28 DIAGNOSIS — F329 Major depressive disorder, single episode, unspecified: Secondary | ICD-10-CM | POA: Diagnosis not present

## 2023-07-28 DIAGNOSIS — F339 Major depressive disorder, recurrent, unspecified: Secondary | ICD-10-CM | POA: Diagnosis present

## 2023-07-28 NOTE — Progress Notes (Signed)
Patient, their spouse, and grandson reported to [facility] for session #4 of Repetitive Transcranial Magnetic Stimulation treatment for Major Depressive Disorder. Patient reported to TMS Tech that anxiety had been increasing at night and they were feeling phantom pulse trains while dreaming. Patient also reported ruminating thoughts that were fear-based in nature. TMS Tech explained to patient that symptoms were psychosomatic and not related to treatment. [employer] Tech provided disclaimer that they were not medically trained, but that the human brain tends to find any solution, whether rational or irrational, to validate increased nervous system. [employer] Tech asked resident provider for additional advice to assist with sleep. Provider recommended increasing Xanax to BID (1x in am, 1x in pm,) for the next 4-5 days. Provider wants to dissuade patient from making increased dosage habit forming, as it may reduce efficacy of treatment. [employer] Tech confirmed with patient that all normal/medications, along with anti-anxiety med (Xanax), had been taken prior to session. Patient's spouse re-confirmed next week's schedule. Spouse plans to attend the first two weeks of sessions to reduce patient's apprehension. Patient confirmed that they will be using a rubber mouthguard during treatments to reduce potential jaw pain/intensity of jaw movement.   Patient's treatment parameters are as follows: A/P -- 14.25?cm, SOA -- 28 degrees SEC, Coil Angle -- ?-10?degrees, Motor Threshold -- 1.55?SMT.?With these parameters, the patient will receive 36 sessions of TMS according to the following protocol: 3000 pulses per session, with stimulation in bursts of pulses lasting 4 seconds at a frequency of 10 Hz, separated by 17 seconds of rest. After determining patient's tx parameters, coil was moved to the treatment location, and the first burst of pulses was applied at a reduced power of 65%. Patient reported no complaints.  TMS Tech slowly titrated treatment power up 5% for every 5-6 minute interval. Patient exhibited increased toleration of pulse trains, concluded the session at 85%. Patient confirmed no discomfort, lightheadedness, or dizziness were felt when departing the session.

## 2023-07-31 ENCOUNTER — Other Ambulatory Visit (HOSPITAL_COMMUNITY): Payer: BC Managed Care – PPO | Attending: Psychiatry

## 2023-07-31 VITALS — BP 139/86 | HR 101 | Temp 98.7°F | Resp 18

## 2023-07-31 DIAGNOSIS — F329 Major depressive disorder, single episode, unspecified: Secondary | ICD-10-CM | POA: Diagnosis present

## 2023-07-31 NOTE — Progress Notes (Signed)
Patient and their spouse reported to [facility] for session #5 of Repetitive Transcranial Magnetic Stimulation treatment for Major Depressive Disorder. [employer] Tech collected weekly PHQ-9 assessment and vital signs. Vitals are as follows: BP=139/86, pulse=101, temp=98.7, Respiratory rate=18. Patient informed TMS Tech that BP remains high when at doctor's offices due to anxiety. TMS re-checked BP after session concluded and BP was 137/82. Pulse decreased to 90. Patient scored a 12 on the PHQ. [employer] Tech confirmed with patient that all normal/medications, along with anti-anxiety med (Xanax), had been taken prior to session. Patient's spouse re-confirmed next week's schedule. Spouse plans to attend the first two weeks of sessions to reduce patient's apprehension. Patient confirmed that they will be using a rubber mouthguard during treatments to reduce potential jaw pain/intensity of jaw movement.   Patient's treatment parameters are as follows: A/P -- 14.25?cm, SOA -- 28 degrees SEC, Coil Angle -- ?-10?degrees, Motor Threshold -- 1.55?SMT.?With these parameters, the patient will receive 36 sessions of TMS according to the following protocol: 3000 pulses per session, with stimulation in bursts of pulses lasting 4 seconds at a frequency of 10 Hz, separated by 17 seconds of rest.  The first burst of pulses was applied at 75% treatment power. Patient reported no complaints or discomfort. TMS Tech slowly titrated treatment power up 5% for every 5-6 minute interval before letting patient build tolerance of 85% for 10 minutes. [employer] Tech verbally confirmed with patient that they were going to titrate them up to 90% for the remaining 5 minutes. Patient exhibited increased toleration of pulse trains and concluded the session at 90%. Patient confirmed no discomfort, lightheadedness, or dizziness were felt when departing the session.

## 2023-08-01 ENCOUNTER — Other Ambulatory Visit (HOSPITAL_BASED_OUTPATIENT_CLINIC_OR_DEPARTMENT_OTHER): Payer: BC Managed Care – PPO

## 2023-08-01 DIAGNOSIS — F329 Major depressive disorder, single episode, unspecified: Secondary | ICD-10-CM | POA: Diagnosis not present

## 2023-08-01 NOTE — Progress Notes (Signed)
Patient reported to [facility] for session #6 of Repetitive Transcranial Magnetic Stimulation treatment for Major Depressive Disorder.  Patient arrived to session alone, but reported that spouse was waiting in the car with their grandson to reduce distractions. High anxiety still reported, but no discomfort or abnormal symptoms have presented at home. [employer] Tech confirmed with patient that all normal/medications, along with anti-anxiety med (Xanax), had been taken prior to session.. Patient confirmed that they will be using a rubber mouthguard during treatments to reduce potential jaw pain/intensity of jaw movement.   Patient's treatment parameters are as follows: A/P -- 14.25?cm, SOA -- 28 degrees SEC, Coil Angle -- ?-10?degrees, Motor Threshold -- 1.55?SMT.?With these parameters, the patient will receive 36 sessions of TMS according to the following protocol: 3000 pulses per session, with stimulation in bursts of pulses lasting 4 seconds at a frequency of 10 Hz, separated by 17 seconds of rest.  The first burst of pulses was applied at 80% treatment power. Patient reported no complaints or discomfort. TMS Tech slowly titrated treatment power up 5% for every 5-6 minute interval to allow patient to build tolerance. [employer] Tech verbally confirmed with patient that they were going to titrate them up to 100% for the remaining 5 minutes. Patient agreed to increase. Patient exhibited increased toleration of pulse trains and concluded the session at 100%. Patient confirmed no discomfort, lightheadedness, or dizziness were felt when departing the session.

## 2023-08-02 ENCOUNTER — Other Ambulatory Visit (HOSPITAL_BASED_OUTPATIENT_CLINIC_OR_DEPARTMENT_OTHER): Payer: BC Managed Care – PPO

## 2023-08-02 DIAGNOSIS — F329 Major depressive disorder, single episode, unspecified: Secondary | ICD-10-CM

## 2023-08-02 NOTE — Progress Notes (Signed)
Patient and their spouse reported to [facility] for session #7 of Repetitive Transcranial Magnetic Stimulation treatment for Major Depressive Disorder. Patient reported some sinus issues (weather-related) but still feeling a good/happy  mood. [employer] Tech confirmed with patient that all normal/medications, along with anti-anxiety med (Xanax), had been taken prior to session. Patient confirmed that they will be using a rubber mouthguard during treatments to reduce potential jaw pain/intensity of jaw movement.   Patient's treatment parameters are as follows: A/P -- 14.25?cm, SOA -- 28 degrees SEC, Coil Angle -- ?-10?degrees, Motor Threshold -- 1.55?SMT.?With these parameters, the patient will receive 36 sessions of TMS according to the following protocol: 3000 pulses per session, with stimulation in bursts of pulses lasting 4 seconds at a frequency of 10 Hz, separated by 17 seconds of rest.  The first burst of pulses was applied at 90% treatment power. Patient reported feeling treatment feeling "hot." TMS Tech immediately paused the session and made adjustments by 2 points on the A/P bar. TMS Tech increased airflow of table fan before resuming the session. No complaints, discomfort, or increased heat was reported. TMS Tech reverted adjustments back to pt's primary settings and no discomfort was reported. TMS Tech observed coil increasing in temperature but remained below 40 degrees celsius. TMS Tech slowly titrated treatment power up 5% for longer durations of time to reduce machine overheating. [employer] Tech verbally confirmed with patient that they were going to titrate them up to 105% for the remaining 6 minutes. Patient agreed to increase. Patient exhibited increased toleration of pulse trains and concluded the session at 105%. Patient confirmed no discomfort, lightheadedness, or dizziness were felt when departing the session.

## 2023-08-03 ENCOUNTER — Other Ambulatory Visit (HOSPITAL_BASED_OUTPATIENT_CLINIC_OR_DEPARTMENT_OTHER): Payer: BC Managed Care – PPO

## 2023-08-03 DIAGNOSIS — F329 Major depressive disorder, single episode, unspecified: Secondary | ICD-10-CM

## 2023-08-03 NOTE — Progress Notes (Signed)
Patient reported alone to [facility] for session #8 of Repetitive Transcranial Magnetic Stimulation treatment for Major Depressive Disorder. Patient reported some sinus issues (weather-related) but still feeling a good/happy mood. [employer] Tech confirmed with patient that all normal/medications, along with anti-anxiety med (Xanax), had been taken prior to session. Patient confirmed that they will be using a rubber mouthguard during treatments to reduce potential jaw pain/intensity of jaw movement.   Patient's treatment parameters are as follows: A/P -- 14.25?cm, SOA -- 28 degrees SEC, Coil Angle -- ?-10?degrees, Motor Threshold -- 1.55?SMT.?With these parameters, the patient will receive 36 sessions of TMS according to the following protocol: 3000 pulses per session, with stimulation in bursts of pulses lasting 4 seconds at a frequency of 10 Hz, separated by 17 seconds of rest.  The first burst of pulses was applied at 100% treatment power. No discomfort or increased heat was reported. TMS Tech observed coil increasing in temperature but remained below 40 degrees celsius. TMS Tech slowly titrated treatment power up 5% for longer durations of time to reduce machine overheating. [employer] Tech verbally confirmed with patient that they were going to titrate them up to 115% for the remaining 6 minutes. Patient agreed to increase. Patient exhibited increased toleration of pulse trains and concluded the session at 115%. Patient confirmed no discomfort, lightheadedness, or dizziness were felt when departing the session.

## 2023-08-04 ENCOUNTER — Other Ambulatory Visit (HOSPITAL_BASED_OUTPATIENT_CLINIC_OR_DEPARTMENT_OTHER): Payer: BC Managed Care – PPO

## 2023-08-04 DIAGNOSIS — F329 Major depressive disorder, single episode, unspecified: Secondary | ICD-10-CM

## 2023-08-04 NOTE — Progress Notes (Signed)
Patient, their spouse, and grandson reported to [facility] for session #9 of Repetitive Transcranial Magnetic Stimulation treatment for Major Depressive Disorder. Patient reported feeling "slightly anxious" with some comorbid sinus issues (weather-related) but still feeling stable in mood. [employer] Tech confirmed with patient that all normal/medications, along with anti-anxiety med (Xanax), had been taken prior to session. Patient confirmed that they will be using a rubber mouthguard during treatments to reduce potential jaw pain/intensity of jaw movement.   Patient's treatment parameters are as follows: A/P -- 14.25?cm, SOA -- 28 degrees SEC, Coil Angle -- ?-10?degrees, Motor Threshold -- 1.55?SMT.?With these parameters, the patient will receive 36 sessions of TMS according to the following protocol: 3000 pulses per session, with stimulation in bursts of pulses lasting 4 seconds at a frequency of 10 Hz, separated by 17 seconds of rest.  The first burst of pulses was applied at 105% treatment power. No discomfort was reported. Patient did report feeling the treatment coil warming up. TMS Tech informed/assured pt that due to back-to-back sessions, and increased power output, coil would warm up faster but never get hot enough to induce injury. Coil increased to a max temperature of 38 degrees celsius, but remained below 40 degrees celsius. TMS Tech slowly titrated treatment power up 5% for longer durations of time to reduce coil temp. [employer] Tech verbally confirmed with patient that they were going to titrate them up to 115% for the remaining 6 minutes. Patient agreed to increase. Patient exhibited increased toleration of pulse trains and concluded the session at 115%. Patient confirmed no discomfort, lightheadedness, or dizziness were felt when departing the session.

## 2023-08-07 ENCOUNTER — Other Ambulatory Visit (HOSPITAL_COMMUNITY): Payer: BC Managed Care – PPO | Attending: Psychiatry

## 2023-08-07 VITALS — BP 131/94 | HR 97 | Temp 98.0°F

## 2023-08-07 DIAGNOSIS — F332 Major depressive disorder, recurrent severe without psychotic features: Secondary | ICD-10-CM | POA: Insufficient documentation

## 2023-08-07 DIAGNOSIS — F329 Major depressive disorder, single episode, unspecified: Secondary | ICD-10-CM | POA: Diagnosis not present

## 2023-08-07 DIAGNOSIS — F411 Generalized anxiety disorder: Secondary | ICD-10-CM | POA: Insufficient documentation

## 2023-08-07 NOTE — Progress Notes (Signed)
Patient, their spouse, and grandson reported to [facility] for session #10 of Repetitive Transcranial Magnetic Stimulation treatment for Major Depressive Disorder. [employer] Tech collected weekly PHQ-9 assessment and vital signs. Vitals are as follows: BP=142/86, pulse=98, temp=97.8. Patient informed TMS Tech that BP remains high when at doctor's offices due to anxiety. TMS re-checked BP after session concluded and BP was 131/94. Pulse decreased to 97. Patient scored an 8 on the PHQ. [employer] Tech confirmed with patient that all normal/medications, along with anti-anxiety med (Xanax), had been taken prior to session. Patient's spouse re-confirmed next week's schedule.  Patient confirmed that they will be using a rubber mouthguard during treatments to reduce potential jaw pain/intensity of jaw movement.   Patient's treatment parameters are as follows: A/P -- 14.25?cm, SOA -- 28 degrees SEC, Coil Angle -- ?-10?degrees, Motor Threshold -- 1.55?SMT.?With these parameters, the patient will receive 36 sessions of TMS according to the following protocol: 3000 pulses per session, with stimulation in bursts of pulses lasting 4 seconds at a frequency of 10 Hz, separated by 17 seconds of rest.  The first burst of pulses was applied at 100% treatment power, due to weekend break. Patient reported no complaints or discomfort. TMS Tech slowly titrated treatment power up 5% for every 7 minute interval after initial titration. [employer] Tech verbally confirmed with patient that they were going to titrate them up to 115% for the remaining 7 minutes. Patient exhibited increased toleration of pulse trains and stated "that really wasn't bad. I wanted to fall asleep during it!" TMS Tech re-confirmed that pt would have to remain awake, but TMS Tech was glad to hear of increased tolerance/decreased anxiety. Patient concluded the session at 115%. Patient confirmed no discomfort, lightheadedness, or dizziness were felt when  departing the session.

## 2023-08-08 ENCOUNTER — Other Ambulatory Visit (HOSPITAL_COMMUNITY): Payer: BC Managed Care – PPO | Attending: Student in an Organized Health Care Education/Training Program

## 2023-08-08 DIAGNOSIS — F411 Generalized anxiety disorder: Secondary | ICD-10-CM | POA: Diagnosis present

## 2023-08-08 DIAGNOSIS — F329 Major depressive disorder, single episode, unspecified: Secondary | ICD-10-CM

## 2023-08-08 DIAGNOSIS — F422 Mixed obsessional thoughts and acts: Secondary | ICD-10-CM | POA: Insufficient documentation

## 2023-08-08 NOTE — Progress Notes (Signed)
Patient reported to [facility] alone for session #11 of Repetitive Transcranial Magnetic Stimulation treatment for Major Depressive Disorder. [employer] Tech confirmed with patient that all normal/medications, along with anti-anxiety med (Xanax), had been taken prior to session.  Patient also confirmed that they will be using a rubber mouthguard during treatments to reduce potential jaw pain/intensity of jaw movement. Patient's treatment parameters are as follows: A/P -- 14.25?cm, SOA -- 28 degrees SEC, Coil Angle -- ?-10?degrees, Motor Threshold -- 1.55?SMT.?With these parameters, the patient will receive 36 sessions of TMS according to the following protocol: 3000 pulses per session, with stimulation in bursts of pulses lasting 4 seconds at a frequency of 10 Hz, separated by 17 seconds of rest.   The first burst of pulses were applied at 110% treatment power. Patient reported no complaints or discomfort. TMS Tech slowly titrated treatment power up 5% for every 8 minute interval after initial titration. [employer] Tech verbally confirmed with patient that they were going to titrate them up to 120% for the remaining 8 minutes. Patient tolerated overall treatment well. Patient stated, "I felt a difference when you increased to 120%, my jaw started moving more and it got louder than it's been." TMS Tech assured pt that increased sound of machine and jaw movement was normal/typical for most patients. [employer] Tech and patient agreed on decision of wearing mouthguard early on, to reduce potential jaw or tooth pain.  Patient concluded the session at 120%. Patient confirmed no discomfort, lightheadedness, or dizziness were felt when departing the session.

## 2023-08-09 ENCOUNTER — Telehealth (HOSPITAL_COMMUNITY): Payer: Self-pay

## 2023-08-09 ENCOUNTER — Other Ambulatory Visit (HOSPITAL_BASED_OUTPATIENT_CLINIC_OR_DEPARTMENT_OTHER): Payer: BC Managed Care – PPO

## 2023-08-09 DIAGNOSIS — F329 Major depressive disorder, single episode, unspecified: Secondary | ICD-10-CM | POA: Diagnosis not present

## 2023-08-09 NOTE — Telephone Encounter (Signed)
TMS C: Pt's spouse called Coordinator to let them know that pt was running a few minutes behind for TMS tx session. Pt is also in "panic mode" because they overslept. Coordinator placed outgoing call to pt (no answer - left vm) to let them know they still have the full hour booked and will not be penalized.

## 2023-08-09 NOTE — Progress Notes (Signed)
Patient reported to [facility] alone for session #12 of Repetitive Transcranial Magnetic Stimulation treatment for Major Depressive Disorder. Patient's treatment parameters are as follows: A/P -- 14.25?cm, SOA -- 28 degrees SEC, Coil Angle -- ?-10?degrees, Motor Threshold -- 1.55?SMT.?With these parameters, the patient will receive 36 sessions of TMS according to the following protocol: 3000 pulses per session, with stimulation in bursts of pulses lasting 4 seconds at a frequency of 10 Hz, separated by 17 seconds of rest. Patient presented with an appropriate affect, stable mood, and denied any suicidal or homicidal ideations.Patient reported no changes in alcohol/substance use, caffeine consumption, sleep pattern or metal implant status.  Patient's spouse called TMS Tech before the session to inform of possibly delayed arrival and potential "panic mode," due to patient waking up late. Patient successfully arrived on time. Pt confirmed increased anxiety but feelings had slightly decreased during commute. [employer] Tech confirmed with patient that all normal/medications, along with anti-anxiety med (Xanax), had been taken prior to session. Patient also confirmed that they will be using a rubber mouthguard during treatments to reduce potential jaw pain/intensity of jaw movement. The first burst of pulses were applied at 110% treatment power. Patient reported no complaints or discomfort. TMS Tech then titrated treatment power up to 115% for 10 minutes of the session. TMS Tech verbally confirmed with patient that pulses were tolerable and no discomfort or sharp pain was felt.  TMS Tech increased titration to 120% for the remaining 10 minutes of session. Patient tolerated overall treatment well and concluded the session at 120%. Patient confirmed no discomfort, lightheadedness, or dizziness were felt when departing the session.

## 2023-08-10 ENCOUNTER — Other Ambulatory Visit (HOSPITAL_BASED_OUTPATIENT_CLINIC_OR_DEPARTMENT_OTHER): Payer: BC Managed Care – PPO | Admitting: *Deleted

## 2023-08-10 DIAGNOSIS — F322 Major depressive disorder, single episode, severe without psychotic features: Secondary | ICD-10-CM

## 2023-08-10 NOTE — Progress Notes (Signed)
Patient reported to [facility] alone for session #13 of Repetitive Transcranial Magnetic Stimulation treatment for Major Depressive Disorder. Patient's treatment parameters are as follows: A/P -- 14.25?cm, SOA -- 28 degrees SEC, Coil Angle -- ?-10?degrees, Motor Threshold -- 1.55?SMT.?With these parameters, the patient will receive 36 sessions of TMS according to the following protocol: 3000 pulses per session, with stimulation in bursts of pulses lasting 4 seconds at a frequency of 10 Hz, separated by 17 seconds of rest. Patient presented with an appropriate affect, stable mood, and denied any suicidal or homicidal ideations.Patient reported no changes in alcohol/substance use, caffeine consumption, sleep pattern or metal implant status.   Patient's spouse called TMS Tech before the session to inform of possibly delayed arrival and potential "panic mode," due to patient waking up late. Patient successfully arrived on time. Pt confirmed increased anxiety but feelings had slightly decreased during commute. [employer] Tech confirmed with patient that all normal/medications, along with anti-anxiety med (Xanax), had been taken prior to session. Patient also confirmed that they will be using a rubber mouthguard during treatments to reduce potential jaw pain/intensity of jaw movement. The first burst of pulses were applied at 110% treatment power. Patient reported no complaints or discomfort. TMS Tech then titrated treatment power up to 115% for 10 minutes of the session. TMS Tech verbally confirmed with patient that pulses were tolerable and no discomfort or sharp pain was felt.  TMS Tech increased titration to 120% for the remaining 10 minutes of session. Patient tolerated overall treatment well and concluded the session at 120%. Patient confirmed no discomfort, lightheadedness, or dizziness were felt when departing the session.

## 2023-08-11 ENCOUNTER — Other Ambulatory Visit (HOSPITAL_BASED_OUTPATIENT_CLINIC_OR_DEPARTMENT_OTHER): Payer: BC Managed Care – PPO

## 2023-08-11 DIAGNOSIS — F329 Major depressive disorder, single episode, unspecified: Secondary | ICD-10-CM | POA: Diagnosis not present

## 2023-08-11 NOTE — Progress Notes (Signed)
Patient reported to [facility] alone for session #14 of Repetitive Transcranial Magnetic Stimulation treatment for Major Depressive Disorder. Patient's treatment parameters are as follows: A/P -- 14.25?cm, SOA -- 28 degrees SEC, Coil Angle -- ?-10?degrees, Motor Threshold -- 1.55?SMT.?With these parameters, the patient will receive 36 sessions of TMS according to the following protocol: 3000 pulses per session, with stimulation in bursts of pulses lasting 4 seconds at a frequency of 10 Hz, separated by 17 seconds of rest. Patient presented with an appropriate affect, stable mood, and denied any suicidal or homicidal ideations.Patient reported no changes in alcohol/substance use, caffeine consumption, sleep pattern or metal implant status.   Patient arrived on time and confirmed that the previous day's session with a different technician had gone well. [employer] Tech confirmed with patient that all normal/medications, along with anti-anxiety med (Xanax), had been taken prior to session. Patient also confirmed that they will be using a rubber mouthguard during treatments to reduce potential jaw pain/intensity of jaw movement. The first burst of pulses were applied at 115% treatment power. Patient reported no complaints or discomfort. TMS Tech kept titration of 115% for 5 minutes before increasing tx power to 120% for 19 minutes. TMS Tech verbally confirmed with patient that pulses were tolerable and no discomfort or sharp pain was felt. TMS Tech decreased titration to 115% for the remaining 2.5 minutes of session, due to machine heating up to 40 degrees celsius. Patient tolerated overall treatment well. TMS Tech confirmed the remaining treatment schedule with patient; patient will finish their 30th session on 09/06/23 and then will begin tapered sessions. Patient agreed to schedule and confirmed no discomfort, lightheadedness, or dizziness were felt when departing the session.

## 2023-08-14 ENCOUNTER — Other Ambulatory Visit (HOSPITAL_COMMUNITY): Payer: BC Managed Care – PPO | Attending: Psychiatry

## 2023-08-14 VITALS — BP 131/85 | HR 93 | Temp 98.0°F | Resp 16

## 2023-08-14 DIAGNOSIS — F329 Major depressive disorder, single episode, unspecified: Secondary | ICD-10-CM | POA: Diagnosis present

## 2023-08-14 NOTE — Progress Notes (Signed)
Patient reported to [facility] alone for session #15 of Repetitive Transcranial Magnetic Stimulation treatment for Major Depressive Disorder. Patient's treatment parameters are as follows: A/P -- 14.25?cm, SOA -- 28 degrees SEC, Coil Angle -- ?-10?degrees, Motor Threshold -- 1.55?SMT.?With these parameters, the patient will receive 36 sessions of TMS according to the following protocol: 3000 pulses per session, with stimulation in bursts of pulses lasting 4 seconds at a frequency of 10 Hz, separated by 17 seconds of rest. Patient presented with an appropriate affect, stable mood, and denied any suicidal or homicidal ideations.Patient reported no changes in alcohol/substance use, caffeine consumption, sleep pattern or metal implant status.   Patient reported an increase in anxiety and ruminating thoughts over the weekend, no external circumstance exacerbated symptoms. Patient stated that anxiety was still present, as the commute  to the clinic is a trigger. [employer] Tech assured patient that a "dip" during treatment was normal and would fade within the next two weeks. [employer] Tech confirmed with patient that all normal/medications, along with anti-anxiety med (Xanax), had been taken prior to session. Patient also confirmed that they will be using a rubber mouthguard during treatments to reduce potential jaw pain/intensity of jaw movement.Weekly vital signs and PHQ assessment were taken and read as follows: BP = 134/91, pulse = 103, respiratory rate = 16, temperature = 97.8. TMS Tech re-checked vitals upon conclusion of the session and BP decreased to 131/98, pulse = 93, temperature = 98.0. PHQ score was a 10, but question #6 was left blank. TMS Tech will retake PHQ score upon next day's session. The first burst of pulses were applied at 110% treatment power. Patient reported no complaints or discomfort. TMS Tech kept titration of 110% for 5 minutes before increasing tx power to 115% for 10  minutes. TMS Tech verbally confirmed with patient that pulses were tolerable and no discomfort or sharp pain was felt. TMS Tech increased titration to 120% for the remaining 10 minutes of session. Patient continued to tolerate overall treatment well with no discomfort reported. Patient confirmed no discomfort, lightheadedness, or dizziness were felt when departing the session.

## 2023-08-15 ENCOUNTER — Other Ambulatory Visit (HOSPITAL_BASED_OUTPATIENT_CLINIC_OR_DEPARTMENT_OTHER): Payer: BC Managed Care – PPO

## 2023-08-15 DIAGNOSIS — F329 Major depressive disorder, single episode, unspecified: Secondary | ICD-10-CM

## 2023-08-15 NOTE — Progress Notes (Signed)
Patient reported to [facility] alone for session #16 of Repetitive Transcranial Magnetic Stimulation treatment for Major Depressive Disorder. Patient's treatment parameters are as follows: A/P -- 14.25?cm, SOA -- 28 degrees SEC, Coil Angle -- ?-10?degrees, Motor Threshold -- 1.55?SMT.?With these parameters, the patient will receive 36 sessions of TMS according to the following protocol: 3000 pulses per session, with stimulation in bursts of pulses lasting 4 seconds at a frequency of 10 Hz, separated by 17 seconds of rest.   Patient arrived and presented with an appropriate affect, stable mood, and denied any suicidal or homicidal ideations. No additional or increased severity of past day's anxiety was reported to the TMS Tech. Patient reported no changes in alcohol/substance use, caffeine consumption, sleep pattern or metal implant status. [employer] Tech confirmed with patient that all normal/medications, along with anti-anxiety med (Xanax), had been taken prior to session. Patient also confirmed that they will be using a rubber mouthguard during treatments to reduce potential jaw pain/intensity of jaw movement. The first burst of pulses were applied at 115% treatment power. Patient reported no complaints or discomfort. TMS Tech kept titration of 115% for 5 minutes before increasing tx power to 120% for the remaining 20 minutes. Patient verbally confirmed that pulses were tolerable and no discomfort or sharp pain was felt. Patient continued to exhibit increased toleration of overall treatment well. Patient confirmed no discomfort, lightheadedness, or vertigo was felt when departing the session.

## 2023-08-16 ENCOUNTER — Other Ambulatory Visit (HOSPITAL_BASED_OUTPATIENT_CLINIC_OR_DEPARTMENT_OTHER): Payer: BC Managed Care – PPO

## 2023-08-16 DIAGNOSIS — F329 Major depressive disorder, single episode, unspecified: Secondary | ICD-10-CM | POA: Diagnosis not present

## 2023-08-16 NOTE — Progress Notes (Signed)
Patient reported to [facility] alone for session #17 of Repetitive Transcranial Magnetic Stimulation treatment for Major Depressive Disorder. Patient's treatment parameters are as follows: A/P -- 14.25?cm, SOA -- 28 degrees SEC, Coil Angle -- ?-10?degrees, Motor Threshold -- 1.55?SMT.?With these parameters, the patient will receive 36 sessions of TMS according to the following protocol: 3000 pulses per session, with stimulation in bursts of pulses lasting 4 seconds at a frequency of 10 Hz, separated by 17 seconds of rest. Patient reported no changes in alcohol/substance use, caffeine consumption, sleep pattern or metal implant status. [employer] Tech confirmed with patient that all normal/medications, along with anti-anxiety med (Xanax), had been taken prior to session. Patient also confirmed that they will be using a rubber mouthguard during treatments to reduce potential jaw pain/intensity of jaw movement.    Patient arrived and presented an appropriate affect, but reported an increase in depressive symptoms and overall anxiety. Patient informed TMS Tech of ruminating thoughts that remain fearful of accidental self-injury (like falling out of a deer stand when hunting), and passive HI thoughts. Patient stated, "most people would dismiss thinking about things like 'what if I could kill a celebrity,' but I just keep thinking about if I could actually do it." TMS Tech informed patient they would send a message to the resident provider to see what they would recommend. [employer] Tech informed Resident Provider of passive HI and overall anxiousness. Resident Provider requested patient remain at the OP office until they could arrive for an in-person assessment to see if a pause in treatment would be beneficial. TMS Tech applied the first burst of pulses at 115% treatment power. Patient reported no complaints or discomfort. TMS Tech kept titration of 115% for 6.5 minutes before increasing tx power to  120% for the remaining 20 minutes. Patient verbally confirmed that pulses were tolerable and no discomfort or sharp pain was felt. Patient continued to exhibit increased toleration of overall treatment well. Resident Provider arrived near the conclusion of the session and spoke with patient while TMS Tech remained in the room. Resident Provider confirmed with patient that a "dip" in progress was normal. Current regression was due to depressive symptoms decreasing successfully, but causing anxiety symptoms to increase in general threshold. Positive behavioral change was noted. Resident provider agreed for patient to remain on course with treatment, but to reach out for any additional concerns or questions. Patient confirmed no discomfort, lightheadedness, or increasing anxious symptoms were felt when departing the session.

## 2023-08-16 NOTE — Progress Notes (Signed)
Received message from [facility] that patient was having intrusive thoughts and increased anxiety.  Went to [employer] room to see the patient.    He reports that he has been more anxious the last few days.  He reports that driving always makes him nervous and that due to the rain the last day this has further exacerbated his anxiety.  He reports that he also watched the movie Twisters last night which had made him very anxious.  He also reports that his wife's mother is in poor health which is weighing heavily on her and in turn this is weighing on him.  He reports that these intrusive thoughts are disturbing to him and he trying to ignore them but they seem to get caught his head.  He reports no thoughts of hurting himself of anyone else.  Discussed with him that it is common to have a worsening of symptoms around the 3-4 week mark of treatment which is where he currently is.  He reports that he has been noticing an improvement until the last few days and he had been able to stop the intrusive thoughts which he had not previously been able to do.  Discussed next steps and he reports wanting to continue with treatment because he has been getting better and is "on the right track."  He reports talking to his counselor yesterday and this was helpful.   Assessment/Plan: He has been having worsening of his anxiety due to multiple factors and family stressors.  His intrusive thoughts are ego-distonic and has no history of SI or HI.  At this time there is no concern for him hurting himself or others.  After shared decision making we will continue with TMS treatments.  Encouraged him to continue working with his counselor and taking his medication as prescribed by his outpatient provider.  Encouraged him to contact his outpatient provider to ensure they are up to date on where he is.  Discussed with him if he has concerns to go to the nearest hospital to be evaluated.

## 2023-08-17 ENCOUNTER — Other Ambulatory Visit (HOSPITAL_BASED_OUTPATIENT_CLINIC_OR_DEPARTMENT_OTHER): Payer: BC Managed Care – PPO

## 2023-08-17 DIAGNOSIS — F329 Major depressive disorder, single episode, unspecified: Secondary | ICD-10-CM | POA: Diagnosis not present

## 2023-08-17 NOTE — Progress Notes (Signed)
Patient reported to [facility] alone for session #18 of Repetitive Transcranial Magnetic Stimulation treatment for Major Depressive Disorder. Patient's treatment parameters are as follows: A/P -- 14.25?cm, SOA -- 28 degrees SEC, Coil Angle -- ?-10?degrees, Motor Threshold -- 1.55?SMT.?With these parameters, the patient will receive 36 sessions of TMS according to the following protocol: 3000 pulses per session, with stimulation in bursts of pulses lasting 4 seconds at a frequency of 10 Hz, separated by 17 seconds of rest. Patient reported no changes in alcohol/substance use, caffeine consumption, sleep pattern or metal implant status. [employer] Tech confirmed with patient that all normal/medications, along with anti-anxiety med (Xanax), had been taken prior to session. Patient also confirmed that they will be using a rubber mouthguard during treatments to reduce potential jaw pain/intensity of jaw movement.    Patient arrived and presented an appropriate affect, stable mood, and denied any SI or HI ideations. Patient reported a decrease in overall anxiousness and depressive symptoms; "I feel much better today." No additional issues were presented during commute to the session. TMS Tech applied the first burst of pulses at 115% treatment power. Patient reported no complaints or discomfort. TMS Tech kept titration of 115% for 5 minutes before increasing tx power to 120% for the remaining 21 minutes. Patient verbally confirmed that pulses were tolerable and no discomfort or sharp pain was felt. Patient continued to tolerate full treatment/full treatment power well. Patient confirmed no discomfort, lightheadedness, or increasing anxiety was felt when departing the session.

## 2023-08-18 ENCOUNTER — Other Ambulatory Visit (HOSPITAL_BASED_OUTPATIENT_CLINIC_OR_DEPARTMENT_OTHER): Payer: BC Managed Care – PPO

## 2023-08-18 DIAGNOSIS — F329 Major depressive disorder, single episode, unspecified: Secondary | ICD-10-CM | POA: Diagnosis not present

## 2023-08-18 NOTE — Progress Notes (Signed)
Patient reported to [facility] alone for session #19 of Repetitive Transcranial Magnetic Stimulation treatment for Major Depressive Disorder. Patient's treatment parameters are as follows: A/P -- 14.25?cm, SOA -- 28 degrees SEC, Coil Angle -- ?-10?degrees, Motor Threshold -- 1.55?SMT.?With these parameters, the patient will receive 36 sessions of TMS according to the following protocol: 3000 pulses per session, with stimulation in bursts of pulses lasting 4 seconds at a frequency of 10 Hz, separated by 17 seconds of rest. Patient reported no changes in alcohol/substance use, caffeine consumption, sleep pattern or metal implant status. [employer] Tech confirmed with patient that all normal/medications, along with anti-anxiety med (Xanax), had been taken prior to session. Patient also confirmed that they will be using a rubber mouthguard during treatments to reduce potential jaw pain/intensity of jaw movement.    Patient arrived and presented an appropriate affect, stable mood, and denied any SI or HI ideations. No additional anxiety or increase in depressive symptoms were reported to the TMS tech. Patient did report that a close friend drove them to the outpatient office for session, and will be picking them up after appt concludes. TMS Tech applied the first burst of pulses at 115% treatment power for the first 4 minutes. Patient reported no complaints or discomfort. TMS Tech increased tx power to 120% for the remaining 22.5 minutes. Patient verbally confirmed that pulses were tolerable and no discomfort or sharp pain was felt. Patient continued to tolerate full treatment/full treatment power well. [employer] Tech confirmed next week's holiday schedule with the patient while escorting them to the lobby. Patient agreed to repeating current titration pattern, and will keep a close eye on overall mood/stability during 4-day break. No discomfort, lightheadedness, or increasing anxiety was felt when  departing the session to the lobby.

## 2023-08-21 ENCOUNTER — Other Ambulatory Visit (HOSPITAL_BASED_OUTPATIENT_CLINIC_OR_DEPARTMENT_OTHER): Payer: BC Managed Care – PPO

## 2023-08-21 VITALS — BP 131/86 | HR 87 | Temp 98.0°F

## 2023-08-21 DIAGNOSIS — F329 Major depressive disorder, single episode, unspecified: Secondary | ICD-10-CM

## 2023-08-21 NOTE — Progress Notes (Signed)
Patient reported to [facility] alone for session #20 of Repetitive Transcranial Magnetic Stimulation treatment for Major Depressive Disorder. Patient's treatment parameters are as follows: A/P -- 14.25?cm, SOA -- 28 degrees SEC, Coil Angle -- ?-10?degrees, Motor Threshold -- 1.55?SMT.?With these parameters, the patient will receive 36 sessions of TMS according to the following protocol: 3000 pulses per session, with stimulation in bursts of pulses lasting 4 seconds at a frequency of 10 Hz, separated by 17 seconds of rest. Patient presented with an appropriate affect, stable mood, and denied any suicidal or homicidal ideations. Patient reported no changes in alcohol/substance use, caffeine consumption, sleep pattern or metal implant status.   Patient arrived and reported an increase in overall mood/motivation, with a significant decrease in anxiety symptoms. [employer] tech affirmed that patient was now out of the 'dip' period, and anxiety was beginning to level out. [employer] Tech confirmed with patient that all normal/medications, along with anti-anxiety med (Xanax), had been taken prior to session. Patient also confirmed that they will be using a rubber mouthguard during treatments to reduce potential jaw pain/intensity of jaw movement. Weekly vital signs and PHQ assessment were taken and read as follows: BP = 145/90 (sitting), pulse = 88, temperature = 98.0. TMS Tech re-checked vitals immediately and again upon conclusion of the session. Second collection of BP (standing) decreased to 138/88, and pulse = 96, temperature = 98.0. Third collection of BP (sitting) was 131/86, pulse = 87. PHQ score was a 9. The first burst of pulses were applied at 115% treatment power. Patient reported no complaints or discomfort. TMS Tech kept titration of 115% for 6.5 minutes before increasing tx power to 120% for the remaining 20 minutes. TMS Tech verbally confirmed with patient that pulses were tolerable and no  discomfort or sharp pain was felt. Slight difficulty in keeping coil connected to patient's scalp was observed during the session, but tx pauses remained short in duration. Patient continues to tolerate treatment well. Patient reported feeling fine/at normal baseline after re-checking BP for the third time. No discomfort, dizziness, or headache was reported by the patient upon their departure from the clinic.

## 2023-08-22 ENCOUNTER — Other Ambulatory Visit (HOSPITAL_BASED_OUTPATIENT_CLINIC_OR_DEPARTMENT_OTHER): Payer: BC Managed Care – PPO

## 2023-08-22 VITALS — BP 134/87 | HR 92 | Temp 98.4°F

## 2023-08-22 DIAGNOSIS — F329 Major depressive disorder, single episode, unspecified: Secondary | ICD-10-CM

## 2023-08-22 NOTE — Progress Notes (Signed)
Patient reported to [facility] alone for session #21 of Repetitive Transcranial Magnetic Stimulation treatment for Major Depressive Disorder. Patient's treatment parameters are as follows: A/P -- 14.25?cm, SOA -- 28 degrees SEC, Coil Angle -- ?-10?degrees, Motor Threshold -- 1.55?SMT.?With these parameters, the patient will receive 36 sessions of TMS according to the following protocol: 3000 pulses per session, with stimulation in bursts of pulses lasting 4 seconds at a frequency of 10 Hz, separated by 17 seconds of rest. Patient presented with an appropriate affect, stable mood, and denied any suicidal or homicidal ideations. Patient reported no changes in alcohol/substance use, caffeine consumption, sleep pattern or metal implant status.[employer] Tech confirmed with patient that all normal/medications, along with anti-anxiety med (Xanax), had been taken prior to session. Patient also confirmed that they will be using a rubber mouthguard during treatments to reduce potential jaw pain/intensity of jaw movement.   Patient arrived and reported a continued increase in overall mood/motivation. Patient emphasized a decrease in depressive symptoms, but ruminating thoughts are still present at nighttime. Patient also asked if side effect of dizziness/vertigo could present during and after treatments. TMS Tech confirmed that treatments could cause lightheadedness/vertigo, but typically only immediately after a treatment session. Patient stated they had issues with vertigo in the past, and related cause to anxiety or blood pressure. [employer] Tech will refer question to resident provider and gauge response for next steps. TMS Tech did check patient's vitals immediately after the tx session, BP (sitting) =134/87, pulse = 92, temperature = 98.4. Vitals were shown and confirmed by patient to be at baseline. The first burst of pulses were applied at 120% treatment power, with verbal consent from the patient  due to vertigo concerns. Patient reported no complaints or discomfort. TMS Tech kept titration of 120% for 13 minutes before checking-in with patient. Patient confirmed pulses were tolerable, no discomfort or sharp pain felt, and was fine to continue on. Treatment power stayed at 120% for the remaining 13 minutes. Patient continues to tolerate treatment well. Patient reported feeling fine/at normal baseline after checking vitals. No discomfort, dizziness, or headache was reported by the patient upon their departure from the clinic.

## 2023-08-23 ENCOUNTER — Telehealth (HOSPITAL_COMMUNITY): Payer: Self-pay

## 2023-08-23 ENCOUNTER — Other Ambulatory Visit (HOSPITAL_BASED_OUTPATIENT_CLINIC_OR_DEPARTMENT_OTHER): Payer: BC Managed Care – PPO

## 2023-08-23 DIAGNOSIS — F329 Major depressive disorder, single episode, unspecified: Secondary | ICD-10-CM | POA: Diagnosis not present

## 2023-08-23 NOTE — Telephone Encounter (Signed)
TMS C: Placed outgoing call to pt's insurance ([employer]) to request prior authorization [phone]) extension. Pt will complete 30th session on 09/06/23, but will be going back to work and needs a bi-weekly tapering schedule. Prior Berkley Harvey is approved until 09/23/23.   Coordinator spoke with representative Vitan B. Once all information had been provided and confirmed, Coordinator explained reasoning for extension. Representative stated that request from a month ago had been approved and prior auth period now reflects 07/25/2023 - 10/26/2022. Coordinator requested a letter be faxed directly, representative was unable to complete request. Representative then transferred Coordinator to Case manager's line Sue Lush [phone]) and was prompted to leave a vm to request letter of approval. Coordinator left vm requesting approval letter and will await either call back or direct fax.

## 2023-08-23 NOTE — Progress Notes (Signed)
Patient reported to [facility] alone for session #22 of Repetitive Transcranial Magnetic Stimulation treatment for Major Depressive Disorder. Patient's treatment parameters are as follows: A/P -- 14.25?cm, SOA -- 28 degrees SEC, Coil Angle -- ?-10?degrees, Motor Threshold -- 1.55?SMT.?With these parameters, the patient will receive 36 sessions of TMS according to the following protocol: 3000 pulses per session, with stimulation in bursts of pulses lasting 4 seconds at a frequency of 10 Hz, separated by 17 seconds of rest. Patient presented with an appropriate affect, stable mood, and denied any suicidal or homicidal ideations. Patient reported no changes in alcohol/substance use, caffeine consumption, sleep pattern or metal implant status.[employer] Tech confirmed with patient that all normal/medications, along with anti-anxiety med (Xanax), had been taken prior to session. Patient also confirmed that they will be using a rubber mouthguard during treatments to reduce potential jaw pain/intensity of jaw movement.   Patient arrived and reported a continued increase in overall mood/motivation. Patient stated that no vertigo/lightheadedness/headache were felt the previous evening or this morning.  Patient informed TMS Tech of holiday festivities that were to occur later on today and tomorrow. The first burst of pulses were applied at 115% treatment power, as requested by the patient to increase overall toleration. Patient reported no complaints or discomfort. TMS Tech kept titration of 115% for 3 minutes before checking-in with patient. Patient confirmed pulses were tolerable, no discomfort or sharp pain felt, and was fine to continue on. Treatment power was increased to 120% for the remaining 23 minutes. Patient continues to tolerate treatment well, with no side effects felt. Patient reported feeling fine/at normal baseline upon conclusion of the treatment session. Patient and [employer] Tech  discussed holiday plans and schedule for tapered sessions. Patient will call TMS Tech after their 30th session, due to going back to work, to schedule bi-weekly sessions. Patient departed from the clinic with no discomfort or concerns.

## 2023-08-28 ENCOUNTER — Other Ambulatory Visit (HOSPITAL_COMMUNITY): Payer: BC Managed Care – PPO | Attending: Psychiatry

## 2023-08-28 ENCOUNTER — Telehealth (HOSPITAL_COMMUNITY): Payer: Self-pay

## 2023-08-28 VITALS — BP 132/89 | HR 94 | Temp 98.2°F | Resp 20

## 2023-08-28 DIAGNOSIS — F329 Major depressive disorder, single episode, unspecified: Secondary | ICD-10-CM | POA: Diagnosis not present

## 2023-08-28 NOTE — Telephone Encounter (Signed)
TMS C: Received incoming follow-up fax from patient's insurance payor [facility] [place]). Payor sent approval of prior authorization extension request for patient's TMS treatments. Request was placed d/t patient needing to complete tapered sessions intermittently. Approved authorization period now reflects 07/25/2023 through 10/27/2023.

## 2023-08-28 NOTE — Progress Notes (Signed)
Patient reported to [facility] alone for session #23 of Repetitive Transcranial Magnetic Stimulation treatment for Major Depressive Disorder. Patient's treatment parameters are as follows: A/P -- 14.25?cm, SOA -- 28 degrees SEC, Coil Angle -- ?-10?degrees, Motor Threshold -- 1.55?SMT.?With these parameters, the patient will receive 36 sessions of TMS according to the following protocol: 3000 pulses per session, with stimulation in bursts of pulses lasting 4 seconds at a frequency of 10 Hz, separated by 17 seconds of rest. Patient presented with an appropriate affect, stable mood, and denied any suicidal or homicidal ideations. Patient reported no changes in alcohol/substance use, caffeine consumption, sleep pattern or metal implant status. [employer] Tech confirmed with patient that all normal/medications, along with anti-anxiety med (Xanax), had been taken prior to session. Patient also confirmed that they will be using a rubber mouthguard during treatments to reduce potential jaw pain/intensity of jaw movement.   Patient arrived and reported an increase in anxiety symptoms along with increased sinus pressure. Patient stated that sinus pressure was inducing ruminating thoughts that something was "wrong." Patient had a 4-day break, due to thanksgiving holiday. [employer] tech affirmed that patient was now out of the 'dip' period, and emphasized the importance of what tapered sessions will be for. TMS Tech explained that treatments have become routine/consistent, and when off-schedule, anxiety may occur. Tapered sessions act as a mental "wean" as patient will have received full effectiveness. Additional sinus issues may not be helping negative thoughts.TMS Tech recommended OTC medications and drainage massages. If symptoms do not remiss, let Tech know. Weekly vital signs and PHQ assessment were taken and read as follows: BP = 123/82 (sitting), pulse = 93, temperature = 98.2, R. Rate = 16, PHQ-9 = 4.  TMS Tech re-checked vitals upon conclusion of the session. Second collection of BP (sitting) = 136/92, and pulse = 89, R. Rate = 20. Third collection of BP (standing) was 132/89, pulse = 94. The first burst of pulses were applied at 115% treatment power. Patient reported no complaints or discomfort. TMS Tech kept titration of 115% for 5 minutes before increasing tx power to 120% for the remaining 21 minutes. TMS Tech verbally confirmed with patient that pulses were tolerable and no discomfort or sharp pain was felt.  Patient continues to tolerate treatment well. Significant improvement in overall symptoms are observed through PHQ-9 scores and general affect. Patient reported feeling fine/at normal baseline after re-checking BP for the third time. No discomfort, dizziness, or headache was reported by the patient upon their departure from the clinic.

## 2023-08-29 ENCOUNTER — Other Ambulatory Visit (HOSPITAL_BASED_OUTPATIENT_CLINIC_OR_DEPARTMENT_OTHER): Payer: BC Managed Care – PPO

## 2023-08-29 DIAGNOSIS — F329 Major depressive disorder, single episode, unspecified: Secondary | ICD-10-CM

## 2023-08-29 NOTE — Progress Notes (Signed)
Patient reported to [facility] alone for session #24 of Repetitive Transcranial Magnetic Stimulation treatment for Major Depressive Disorder. Patient's treatment parameters are as follows: A/P -- 14.25?cm, SOA -- 28 degrees SEC, Coil Angle -- ?-10?degrees, Motor Threshold -- 1.55?SMT.?With these parameters, the patient will receive 36 sessions of TMS according to the following protocol: 3000 pulses per session, with stimulation in bursts of pulses lasting 4 seconds at a frequency of 10 Hz, separated by 17 seconds of rest. Patient presented with an appropriate affect, stable mood, and denied any suicidal or homicidal ideations. Patient reported no changes in alcohol/substance use, caffeine consumption, sleep pattern or metal implant status. [employer] Tech confirmed with patient that all normal/medications, along with anti-anxiety med (Xanax), had been taken prior to session. Patient also confirmed that they will be using a rubber mouthguard during treatments to reduce potential jaw pain/intensity of jaw movement.   Patient arrived and did not report additional anxiety or increasing rumination of negative thoughts. Patient stated their commute to the clinic was fine, as no snow had reach their home. Patient was patient and agreeable while the TMS Tech obtained all session items (due to late arrival from weather-related traffic).  The first burst of pulses were applied at 115% treatment power. Patient reported no complaints or discomfort. TMS Tech kept titration of 115% for 2 minutes before increasing tx power to 120% for the remaining 24 minutes. TMS Tech verbally confirmed with patient that active pulses were tolerable; no discomfort or sharp pain was felt. Patient continues to tolerate treatment well.  Patient reported feeling fine/at normal baseline upon conclusion of the session. No discomfort, dizziness, or headache was reported by the patient upon their departure from the clinic.

## 2023-08-30 ENCOUNTER — Other Ambulatory Visit (HOSPITAL_BASED_OUTPATIENT_CLINIC_OR_DEPARTMENT_OTHER): Payer: BC Managed Care – PPO

## 2023-08-30 DIAGNOSIS — F329 Major depressive disorder, single episode, unspecified: Secondary | ICD-10-CM

## 2023-08-30 NOTE — Progress Notes (Signed)
Patient reported to [facility] alone for session #25 of Repetitive Transcranial Magnetic Stimulation treatment for Major Depressive Disorder. Patient's treatment parameters are as follows: A/P -- 14.25?cm, SOA -- 28 degrees SEC, Coil Angle -- ?-10?degrees, Motor Threshold -- 1.55?SMT.?With these parameters, the patient will receive 36 sessions of TMS according to the following protocol: 3000 pulses per session, with stimulation in bursts of pulses lasting 4 seconds at a frequency of 10 Hz, separated by 17 seconds of rest. Patient presented with an appropriate affect, stable mood, and denied any suicidal or homicidal ideations. Patient reported no changes in alcohol/substance use, caffeine consumption, sleep pattern or metal implant status. [employer] Tech confirmed with patient that all normal/medications, along with anti-anxiety med (Xanax), had been taken prior to session. Patient also confirmed that they will be using a rubber mouthguard during treatments to reduce potential jaw pain/intensity of jaw movement.   Patient arrived and reported seeing a medical emergency close to their home before commuting to session. Due to event being close to home, patient reported some negative thinking, but handled situation well. Patient presented a calm demeanor and independent active dismissal of negative thoughts when relaying context of event. Positive behavior change was noted as patient continues to see remission of symptoms. Sinus congestion/build-up still remains. Patient reported that drainage massages help to alleviate pressure, will try OTC medications after appt with counselor later on in the day. [employer] Tech agreed. The first burst of pulses were applied at 115% treatment power. Patient reported no complaints or discomfort. TMS Tech kept titration of 115% for 6 minutes (due to pressure build-up) before increasing tx power to 120% for the remaining 20 minutes. TMS Tech verbally confirmed with  patient that active pulses were tolerable; no discomfort or sharp pain was felt. Patient continues to tolerate treatment well.  Patient reported feeling fine/at normal baseline upon conclusion of the session. No discomfort, dizziness, or headache was reported by the patient upon their departure from the clinic.

## 2023-08-31 ENCOUNTER — Other Ambulatory Visit (HOSPITAL_BASED_OUTPATIENT_CLINIC_OR_DEPARTMENT_OTHER): Payer: BC Managed Care – PPO

## 2023-08-31 DIAGNOSIS — F329 Major depressive disorder, single episode, unspecified: Secondary | ICD-10-CM | POA: Diagnosis not present

## 2023-08-31 NOTE — Progress Notes (Signed)
Patient reported to [facility] alone for session #26 of Repetitive Transcranial Magnetic Stimulation treatment for Major Depressive Disorder. Patient's treatment parameters are as follows: A/P -- 14.25?cm, SOA -- 28 degrees SEC, Coil Angle -- ?-10?degrees, Motor Threshold -- 1.55?SMT.?With these parameters, the patient will receive 36 sessions of TMS according to the following protocol: 3000 pulses per session, with stimulation in bursts of pulses lasting 4 seconds at a frequency of 10 Hz, separated by 17 seconds of rest. Patient presented with an appropriate affect, stable mood, and denied any suicidal or homicidal ideations. Patient reported no changes in alcohol/substance use, caffeine consumption, sleep pattern or metal implant status. [employer] Tech confirmed with patient that all normal/medications, along with anti-anxiety med (Xanax), had been taken prior to session. Patient also confirmed that they will be using a rubber mouthguard during treatments to reduce potential jaw pain/intensity of jaw movement.   Patient arrived and stated they had nothing significant to report, but did mention how they felt "better today than I have all week." Sinus pressure had decreased after taking OTC medications and performing drainage massages the previous evening. The first burst of pulses were applied at 115% treatment power. Patient reported no complaints or discomfort. TMS Tech kept titration of 115% for 5 minutes (due to patient request for overall acclimation) before increasing tx power to 120% for the remaining 20 minutes. TMS Tech verbally confirmed with patient that active pulses were tolerable; no discomfort or sharp pain was felt. Patient continues to tolerate treatment well.  Patient reported feeling fine/at normal baseline upon conclusion of the session. No discomfort, dizziness, or headache was reported by the patient upon their departure from the clinic.

## 2023-09-01 ENCOUNTER — Other Ambulatory Visit (HOSPITAL_COMMUNITY): Payer: BC Managed Care – PPO

## 2023-09-04 ENCOUNTER — Other Ambulatory Visit (HOSPITAL_BASED_OUTPATIENT_CLINIC_OR_DEPARTMENT_OTHER): Payer: BC Managed Care – PPO

## 2023-09-04 VITALS — BP 133/89 | HR 90 | Temp 97.8°F | Resp 20

## 2023-09-04 DIAGNOSIS — F329 Major depressive disorder, single episode, unspecified: Secondary | ICD-10-CM | POA: Diagnosis not present

## 2023-09-04 NOTE — Progress Notes (Signed)
Patient reported to [facility] alone for session #27 of Repetitive Transcranial Magnetic Stimulation treatment for Major Depressive Disorder. Patient's treatment parameters are as follows: A/P -- 14.25?cm, SOA -- 28 degrees SEC, Coil Angle -- ?-10?degrees, Motor Threshold -- 1.55?SMT.?With these parameters, the patient will receive 36 sessions of TMS according to the following protocol: 3000 pulses per session, with stimulation in bursts of pulses lasting 4 seconds at a frequency of 10 Hz, separated by 17 seconds of rest. Patient presented with an appropriate affect, stable mood, and denied any suicidal or homicidal ideations. Patient reported no changes in alcohol/substance use, caffeine consumption, sleep pattern or metal implant status. [employer] Tech confirmed with patient that all normal/medications, along with anti-anxiety med (Xanax), had been taken prior to session. Patient also confirmed that they will be using a rubber mouthguard during treatments to reduce potential jaw pain/intensity of jaw movement.   Patient arrived and reported increased depression and anxiety symptoms, along with ruminating thoughts, over the weekend. Holiday stressors were mentioned, but patient stated no change in external or environmental circumstances. [employer] tech affirmed that patient was still out of the 'dip' period, and emphasized the importance/purpose of tapering last 5 sessions after concluding the 30th. Patient confirmed they have an appointment with their counselor later on today to assess recent dip, and will solidify FMLA extension paperwork. Weekly vital signs and PHQ assessment were taken and read as follows: BP = 133/89 (sitting), pulse = 90, temperature = 97.8, respiratory Rate = 20, PHQ-9 = 10. The first burst of pulses were applied at 115% treatment power. Patient reported no complaints or discomfort. TMS Tech kept titration of 115% for 6 minutes before increasing tx power to 120% for the  remaining 20 minutes. TMS Tech verbally confirmed with patient that pulses were tolerable and no discomfort or sharp pain was felt. Patient continues to tolerate treatment well. Patient reported feeling fine/at normal baseline; No second reading of vitals needed. No discomfort, dizziness, or headache was reported by the patient upon their departure from the clinic.

## 2023-09-05 ENCOUNTER — Other Ambulatory Visit (HOSPITAL_BASED_OUTPATIENT_CLINIC_OR_DEPARTMENT_OTHER): Payer: BC Managed Care – PPO

## 2023-09-05 DIAGNOSIS — F329 Major depressive disorder, single episode, unspecified: Secondary | ICD-10-CM

## 2023-09-05 NOTE — Progress Notes (Signed)
Called patient to discuss medication changes made by outpatient provider.  He reports that he started the changes this morning and that so far he is doing well without any side effects.  He reports that no worsening of his symptoms recently.  He reports that he continues to have sporadic intrusive thoughts but that he is able to quickly dispel them.  He reports no SI, HI, or AVH.  He reports overall doing well with TMS treatment.  He reports no other concerns are present.   Clarification to note earlier today- Medication changes made by outpatient provider to patient's current medication regiment are as follows: Abilify increased to 4 mg daily and clomipramine increased to 175 mg (75 mg twice daily plus an additional 25 mg dose).  (He is not currently on Klonopin as note from earlier today had stated Klonopin dose increase to 175 mg which is incorrect.)    Arna Snipe MD Resident   09/06/23:  [employer] Coordinator was alerted of incorrect medication change. Coordinator confused Clomipramine with Klonopin during 09/05/23 TMS appt. Coordinator was educated on Patent examiner types/level of risk by providers. Coordinator apologized to pt for mistake and reconfirmed all correct/current adjustments on 09/06/23.

## 2023-09-05 NOTE — Progress Notes (Signed)
Patient reported to [facility] alone for session #28 of Repetitive Transcranial Magnetic Stimulation treatment for Major Depressive Disorder. Patient's treatment parameters are as follows: A/P -- 14.25?cm, SOA -- 28 degrees SEC, Coil Angle -- ?-10?degrees, Motor Threshold -- 1.55?SMT.?With these parameters, the patient will receive 36 sessions of TMS according to the following protocol: 3000 pulses per session, with stimulation in bursts of pulses lasting 4 seconds at a frequency of 10 Hz, separated by 17 seconds of rest. Patient presented with an appropriate affect, stable mood, and denied any suicidal or homicidal ideations. Patient reported no changes in alcohol/substance use, caffeine consumption, sleep pattern or metal implant status. [employer] Tech confirmed with patient that all normal/medications, along with anti-anxiety med (Xanax), had been taken prior to session. Patient also confirmed that they will be using a rubber mouthguard during treatments to reduce potential jaw pain/intensity of jaw movement.   Patient arrived and reported feeling back to their baseline status (better than previous day). Patient relayed that FMLA extension had been filed by their counselor and medication changes were made. Patient is now taking 4 mg of Abilify and 175 mg of Klonipin (75 mg BID & 25 mg 1/pd). [employer] Tech emphasized the importance of consistency/sameness to gauge effectiveness of treatment, but would relay changes to resident provider. Patient confirmed they will communicate with their employer later on in the day. Will be able to solidify taper schedule soon. The first burst of pulses were applied at 115% treatment power. Patient reported no complaints or discomfort. TMS Tech kept titration of 115% for 4.5 minutes before increasing tx power to 120% for the remaining 22 minutes. TMS Tech verbally confirmed with patient that pulses were tolerable and no discomfort or sharp pain was felt.  Patient continues to tolerate treatment well. Patient reported feeling stable upon conclusion of treatment. No discomfort, dizziness, or headaches were reported or observed upon patient's departure from the clinic.

## 2023-09-06 ENCOUNTER — Other Ambulatory Visit (HOSPITAL_BASED_OUTPATIENT_CLINIC_OR_DEPARTMENT_OTHER): Payer: BC Managed Care – PPO

## 2023-09-06 DIAGNOSIS — F329 Major depressive disorder, single episode, unspecified: Secondary | ICD-10-CM

## 2023-09-06 NOTE — Progress Notes (Signed)
Patient reported to [facility] alone for session #29 of Repetitive Transcranial Magnetic Stimulation treatment for Major Depressive Disorder. Patient's treatment parameters are as follows: A/P -- 14.25?cm, SOA -- 28 degrees SEC, Coil Angle -- ?-10?degrees, Motor Threshold -- 1.55?SMT.?With these parameters, the patient will receive 35 sessions of TMS according to the following protocol: 3000 pulses per session, with stimulation in bursts of pulses lasting 4 seconds at a frequency of 10 Hz, separated by 17 seconds of rest. Patient presented with an appropriate affect, stable mood, and denied any suicidal or homicidal ideations. Patient reported no changes in alcohol/substance use, caffeine consumption, sleep pattern or metal implant status. [employer] Tech confirmed with patient that all normal/medications had been taken prior to session. Patient also confirmed that they will be using a rubber mouthguard during treatments to reduce potential jaw pain/intensity of jaw movement.   Patient arrived and relayed that extension for work leave had been approved by their employer. However, patient verbalized confusion regarding verbiage within extension paperwork. Patient showed TMS Tech the written approval as short term disability was listed as approved, but FMLA was listed as denied. Paperwork stated, "Patient will return to work on January 20th, 2025." [employer] Tech alerted patient that they were new/still learning of FMLA process, but would conduct research. In the meantime, patient should call employer directly to confirm all provided information. [employer] Tech apologized to pt for medication confusion from previous day. TMS Tech confirmed again what medication was adjusted and by how much (4 mg of Abilify and 175 mg of Clomipramine (75 mg BID & 25 mg 1/pd).  Patient stated the updated prescription had not become available at home pharmacy yet, but would hopefully be able to pick it up later on in the  day.   The first burst of pulses were applied at 115% treatment power. Patient reported no complaints or discomfort during initial titration. TMS Tech kept titration of 115% for 6 minutes before increasing tx power to 120% for the remaining 20 minutes. Patient verbally confirmed that pulses were tolerable. Still no discomfort or sharp pain felt. Patient continues to tolerate treatment well. Patient reported feeling stable upon conclusion of treatment. [employer] Tech and patient discussed tapering schedule (still to be solidified). Patient requested skipping 09/08/23 appt due to a personal conflict. [employer] Tech alerted patient of open book for that day, patient stated they will make final decision tomorrow (12/12). No discomfort, dizziness, or headaches were reported or observed upon patient's departure from the clinic.

## 2023-09-07 ENCOUNTER — Other Ambulatory Visit (HOSPITAL_COMMUNITY): Payer: BC Managed Care – PPO

## 2023-09-07 DIAGNOSIS — F329 Major depressive disorder, single episode, unspecified: Secondary | ICD-10-CM

## 2023-09-07 NOTE — Progress Notes (Signed)
Patient reported to [facility] alone for session #30 of Repetitive Transcranial Magnetic Stimulation treatment for Major Depressive Disorder. Patient's treatment parameters are as follows: A/P -- 14.25?cm, SOA -- 28 degrees SEC, Coil Angle -- ?-10?degrees, Motor Threshold -- 1.55?SMT.?With these parameters, the patient will receive 35 sessions of TMS according to the following protocol: 3000 pulses per session, with stimulation in bursts of pulses lasting 4 seconds at a frequency of 10 Hz, separated by 17 seconds of rest. Patient presented with an appropriate affect, stable mood, and denied any suicidal or homicidal ideations. Patient reported no changes in alcohol/substance use, caffeine consumption, sleep pattern or metal implant status. [employer] Tech confirmed with patient that all normal/medications had been taken prior to session. Patient also confirmed that they will be using a rubber mouthguard during treatments to reduce potential jaw pain/intensity of jaw movement.   Patient arrived and exhibited a happy/relaxed demeanor. Patient informed TMS Tech that they spoke with their employer and provided TMS Tech's contact info in case they required additional documentation. [employer] tech confirmed that they would be fine to speak to their employer as long as patient was consenting. Patient agreed. TMS Tech and Patient quickly discussed tapering schedule, and solidified the first tapering session for Monday (09/11/23). Patient will let TMS Tech know on Monday when the 2nd taper could occur. The first burst of pulses were applied at 115% treatment power. Patient reported no complaints or discomfort during initial titration. TMS Tech kept titration of 115% for 6 minutes before increasing tx power to 120% for the remaining 20 minutes. Patient verbally confirmed that pulses were tolerable; still no discomfort or sharp pain. Patient continues to tolerate treatment well. Patient reported feeling  stable upon conclusion of treatment. Patient stated they were "relieved to have made it to their 30th session," and TMS Tech offered congratulations. No discomfort, dizziness, or headaches were reported or observed upon patient's departure from the clinic.

## 2023-09-11 ENCOUNTER — Other Ambulatory Visit (HOSPITAL_COMMUNITY): Payer: BC Managed Care – PPO

## 2023-09-11 VITALS — BP 131/87 | HR 91 | Temp 98.4°F | Resp 20

## 2023-09-11 DIAGNOSIS — F329 Major depressive disorder, single episode, unspecified: Secondary | ICD-10-CM | POA: Diagnosis not present

## 2023-09-11 NOTE — Progress Notes (Signed)
Patient reported to [facility] alone for session #31 of Repetitive Transcranial Magnetic Stimulation treatment for Major Depressive Disorder. Patient's treatment parameters are as follows: A/P -- 14.25?cm, SOA -- 28 degrees SEC, Coil Angle -- ?-10?degrees, Motor Threshold -- 1.55?SMT.?With these parameters, the patient will receive 36 sessions of TMS according to the following protocol: 3000 pulses per session, with stimulation in bursts of pulses lasting 4 seconds at a frequency of 10 Hz, separated by 17 seconds of rest. Patient presented with an appropriate affect, stable mood, and denied any suicidal or homicidal ideations. Patient reported no changes in alcohol/substance use, caffeine consumption, sleep pattern or metal implant status. [employer] Tech confirmed with patient that all normal/medications, along with anti-anxiety med (Xanax), had been taken prior to session. Patient also confirmed that they will be using a rubber mouthguard during treatments to reduce potential jaw pain/intensity of jaw movement.   Patient arrived and reported increased ruminating thoughts over the weekend, due to a death of a close friend of their family. Pt informed TMS Tech they had another session with their counselor on 12/18 to discuss bereavement. [employer] tech offered sympathy and support while assessing vital signs. Pt took off their hat and exhibited a new haircut (buzzcut). [employer] Tech informed pt that pulse trains may feel more noticeable due to their haircut, but still should not cause any pain or discomfort. Vital signs and PHQ assessment read as follows: BP = 133/93 (sitting), pulse = 94, temperature = 98.4, Respiratory Rate = 20, PHQ-9 = 10. TMS Tech asked the pt to stand to reassess BP: 105/80 (standing), pulse = 107. [employer] Tech confirmed with pt they were feeling stable, pt agreed. TMS Tech will check vital for a third time at conclusion of session. The first burst of pulses were applied at 110%  treatment power, due to new haircut. Patient reported no complaints or discomfort. TMS Tech increased and kept titration to 115% for 5 minutes before reconfirming pt's comfort/stability. Pt reported no discomfort or pain. TMS Tech increased tx power to 120% for the remaining 16 minutes. [employer] Tech verbally confirmed with patient at remaining 5 minutes that pulses were tolerable. Patient continues to tolerate treatment well. Patient reported feeling fine/at normal baseline at the end of session; third BP check reads as follows: BP = 131/87, pulse = 91. No discomfort, dizziness, or headache was reported by the patient upon their departure from the clinic. Pt will complete next taper on 09/13/23.

## 2023-09-13 ENCOUNTER — Other Ambulatory Visit (HOSPITAL_COMMUNITY): Payer: BC Managed Care – PPO

## 2023-09-13 DIAGNOSIS — F329 Major depressive disorder, single episode, unspecified: Secondary | ICD-10-CM

## 2023-09-13 NOTE — Progress Notes (Signed)
Patient reported to [facility] alone for session #32 of Repetitive Transcranial Magnetic Stimulation treatment for Major Depressive Disorder. Patient's treatment parameters are as follows: A/P -- 14.25?cm, SOA -- 28 degrees SEC, Coil Angle -- ?-10?degrees, Motor Threshold -- 1.55?SMT.?With these parameters, the patient will receive 36 sessions of TMS according to the following protocol: 3000 pulses per session, with stimulation in bursts of pulses lasting 4 seconds at a frequency of 10 Hz, separated by 17 seconds of rest. Patient presented with an appropriate affect, stable mood, and denied any suicidal or homicidal ideations. Patient reported no changes in alcohol/substance use, caffeine consumption, sleep pattern or metal implant status. [employer] Tech confirmed with patient that all normal/medications, along with anti-anxiety med (Xanax), had been taken prior to session. Patient also confirmed that they will be using a rubber mouthguard during treatments to reduce potential jaw pain/intensity of jaw movement.   Patient arrived and reported a stable mood, with no intermittent emotional 'dips' observed. The first burst of pulses were applied at 115% treatment power. Patient reported no complaints or discomfort. TMS Tech increased and kept titration to 115% for 5 minutes before reconfirming pt's comfort/stability. Pt again reported no discomfort or pain. TMS Tech increased tx power to 120% for the remaining 21 minutes. Patient continues to tolerate treatment well. Patient reported feeling fine/at normal baseline at the end of session. Patient and TMS Tech discussed scheduling the next tapered session. Patient elected for session to occur on 09/18/23 and will let TMS Tech know about final 2 sessions, due to the holidays. No discomfort, dizziness, or headache was reported by the patient upon their departure from the clinic. Pt will complete next taper on 09/18/23.

## 2023-09-18 ENCOUNTER — Other Ambulatory Visit (HOSPITAL_COMMUNITY): Payer: BC Managed Care – PPO

## 2023-09-18 VITALS — BP 125/88 | HR 91 | Temp 97.9°F | Resp 20

## 2023-09-18 DIAGNOSIS — F329 Major depressive disorder, single episode, unspecified: Secondary | ICD-10-CM | POA: Diagnosis not present

## 2023-09-18 NOTE — Progress Notes (Signed)
Patient reported to [facility] alone for session #33 of Repetitive Transcranial Magnetic Stimulation treatment for Major Depressive Disorder. Patient's treatment parameters are as follows: A/P -- 14.25?cm, SOA -- 28 degrees SEC, Coil Angle -- ?-10?degrees, Motor Threshold -- 1.55?SMT.?With these parameters, the patient will receive 36 sessions of TMS according to the following protocol: 3000 pulses per session, with stimulation in bursts of pulses lasting 4 seconds at a frequency of 10 Hz, separated by 17 seconds of rest. Patient presented with an appropriate affect, stable mood, and denied any suicidal or homicidal ideations. Patient reported no changes in alcohol/substance use, caffeine consumption, sleep pattern or metal implant status. [employer] Tech confirmed with patient that all normal/medications, along with anti-anxiety med (Xanax), had been taken prior to session. Patient also confirmed that they will be using a rubber mouthguard during treatments to reduce potential jaw pain/intensity of jaw movement.   Patient and their spouse arrived to session. Patient reported feeling stressed due to the holiday season, but stress is not exacerbating depressive or anxious symptoms. Weekly vital signs and PHQ assessment were given and read as follows: BP = 125/88 (sitting), pulse = 91, temperature = 97.9, Respiratory Rate = 20, PHQ-9 = 6.  The first burst of pulses were applied at 115% treatment power, due to taper interval. Patient reported no complaints or discomfort. TMS Tech remained at 115% for 5 minutes before increasing titration to 120%. Patient reported no discomfort or sharp pain. [employer] Tech verbally confirmed with patient at remaining 13 minutes, and again at 5 minutes that pulses were tolerable. Patient continues to tolerate treatment well. Patient reported feeling fine/at normal baseline at the end of session. No discomfort, dizziness, or headache was reported by the patient upon  their departure from the clinic. Pt will complete next taper on 09/25/23.

## 2023-09-25 ENCOUNTER — Other Ambulatory Visit (HOSPITAL_BASED_OUTPATIENT_CLINIC_OR_DEPARTMENT_OTHER): Payer: BC Managed Care – PPO

## 2023-09-25 ENCOUNTER — Telehealth (HOSPITAL_COMMUNITY): Payer: Self-pay

## 2023-09-25 VITALS — BP 128/83 | HR 96 | Temp 98.2°F | Resp 16

## 2023-09-25 DIAGNOSIS — F329 Major depressive disorder, single episode, unspecified: Secondary | ICD-10-CM | POA: Diagnosis not present

## 2023-09-25 NOTE — Telephone Encounter (Signed)
TMS C: Pt called Coordinator and stated they are running behind during their commute to session, d/t having their grandson with them. Coordinator assured pt that tardiness was not a problem and would see them soon.

## 2023-09-28 ENCOUNTER — Other Ambulatory Visit (HOSPITAL_COMMUNITY): Payer: BC Managed Care – PPO | Attending: Psychiatry

## 2023-09-28 VITALS — BP 126/87 | HR 89 | Temp 97.7°F | Resp 20

## 2023-09-28 DIAGNOSIS — F411 Generalized anxiety disorder: Secondary | ICD-10-CM | POA: Insufficient documentation

## 2023-09-28 DIAGNOSIS — F422 Mixed obsessional thoughts and acts: Secondary | ICD-10-CM | POA: Diagnosis not present

## 2023-09-28 DIAGNOSIS — F329 Major depressive disorder, single episode, unspecified: Secondary | ICD-10-CM | POA: Diagnosis present

## 2023-09-28 NOTE — Progress Notes (Signed)
 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 Patient's treatment parameters are as follows: A/P -- 14.25?cm, SOA -- 28 degrees SEC, Coil Angle -- ?-10?degrees, Motor Threshold -- 1.55?SMT.?With these parameters, the patient will receive 36 sessions of TMS according to the following protocol: 3000 pulses per session, with stimulation in bursts of pulses lasting 4 seconds at a frequency of 10 Hz, separated by 17 seconds of rest. Patient presented with an appropriate affect, stable mood, and denied any suicidal or homicidal ideations. Patient reported no changes in alcohol/substance use, caffeine consumption, sleep pattern or metal implant status. [employer] Tech confirmed with patient that all normal/medications, along with anti-anxiety med (Xanax), had been taken prior to session. Patient also confirmed that they will be using a rubber mouthguard during treatments to reduce potential jaw pain/intensity of jaw movement.   Patient arrived with their spouse and grandson to session. Patient exhibited a quiet demeanor and reported increased anxiousness about completing treatment. TMS Tech re-explained the importance of the tapered sessions and building new routines. TMS Tech provided next steps for if patient needs crisis intervention, or would like another course of treatment one year after final session. Weekly vital signs and PHQ assessment were given and read as follows: BP = 121/90 (sitting), pulse = 85, temperature = 97.7, Respiratory Rate = 20, PHQ-9 = 7. A second round of vitals were taken 5 minutes later, due to increased BP. Second set of vitals read as follows: BP = 126/87 (standing), pulse = 89. The first burst of pulses were applied at 115% treatment power, due to taper interval. Patient reported no complaints or discomfort. TMS Tech remained at 115% for 6 minutes before increasing  titration to 120%. Patient reported no discomfort or sharp pain. [employer] Tech verbally confirmed with patient at remaining 12 minutes, and again at 6 minutes, that no abnormal symptoms were felt/experienced. Patient continues to tolerate treatment well. Upon conclusion, Patient thanked the [employer] Tech for their time and would like to be informed if [facility] starts offering TMS treatment for OCD. [employer] Tech agreed to inform patient if program gets OCD approval/training.  Patient reported feeling fine/at normal baseline while walking to the lobby. Patient departed from the clinic with no discomfort or complaints.

## 2023-09-29 ENCOUNTER — Other Ambulatory Visit (HOSPITAL_COMMUNITY): Payer: BC Managed Care – PPO

## 2023-10-05 ENCOUNTER — Telehealth (HOSPITAL_COMMUNITY): Payer: Self-pay

## 2023-10-05 NOTE — Telephone Encounter (Signed)
 Coordinator emailed pt a survey link for recent [employer] treatment experience and completion, due to inactive MyChart status.

## 2023-12-22 ENCOUNTER — Other Ambulatory Visit (HOSPITAL_COMMUNITY)
